# Patient Record
Sex: Male | Born: 1965 | Race: White | Hispanic: No | Marital: Married | Smoking: Former smoker
Health system: Southern US, Community
[De-identification: ages and names within clinical notes are randomized; demographics above are authoritative.]

## PROBLEM LIST (undated history)

## (undated) DIAGNOSIS — H34239 Retinal artery branch occlusion, unspecified eye: Secondary | ICD-10-CM

## (undated) DIAGNOSIS — I4891 Unspecified atrial fibrillation: Secondary | ICD-10-CM

## (undated) DIAGNOSIS — I10 Essential (primary) hypertension: Secondary | ICD-10-CM

## (undated) DIAGNOSIS — Q231 Congenital insufficiency of aortic valve: Secondary | ICD-10-CM

## (undated) DIAGNOSIS — N200 Calculus of kidney: Secondary | ICD-10-CM

## (undated) DIAGNOSIS — G43909 Migraine, unspecified, not intractable, without status migrainosus: Secondary | ICD-10-CM

## (undated) DIAGNOSIS — I639 Cerebral infarction, unspecified: Secondary | ICD-10-CM

## (undated) DIAGNOSIS — R011 Cardiac murmur, unspecified: Secondary | ICD-10-CM

## (undated) HISTORY — PX: TONSILLECTOMY: SUR1361

## (undated) HISTORY — DX: Cardiac murmur, unspecified: R01.1

## (undated) HISTORY — PX: HERNIA REPAIR: SHX51

## (undated) HISTORY — DX: Cerebral infarction, unspecified: I63.9

## (undated) HISTORY — PX: OTHER SURGICAL HISTORY: SHX169

---

## 2000-12-23 ENCOUNTER — Encounter: Payer: Self-pay | Admitting: Internal Medicine

## 2000-12-23 ENCOUNTER — Emergency Department (HOSPITAL_COMMUNITY): Admission: EM | Admit: 2000-12-23 | Discharge: 2000-12-23 | Payer: Self-pay | Admitting: Internal Medicine

## 2004-03-17 ENCOUNTER — Ambulatory Visit: Payer: Self-pay | Admitting: Orthopedic Surgery

## 2004-03-17 ENCOUNTER — Ambulatory Visit (HOSPITAL_COMMUNITY): Admission: RE | Admit: 2004-03-17 | Discharge: 2004-03-17 | Payer: Self-pay | Admitting: Family Medicine

## 2005-01-24 ENCOUNTER — Observation Stay (HOSPITAL_COMMUNITY): Admission: AD | Admit: 2005-01-24 | Discharge: 2005-01-25 | Payer: Self-pay | Admitting: Cardiology

## 2005-01-24 ENCOUNTER — Encounter: Payer: Self-pay | Admitting: Emergency Medicine

## 2005-01-24 ENCOUNTER — Ambulatory Visit: Payer: Self-pay | Admitting: Cardiology

## 2005-01-25 ENCOUNTER — Ambulatory Visit: Payer: Self-pay

## 2005-01-25 ENCOUNTER — Observation Stay (HOSPITAL_COMMUNITY): Admission: AD | Admit: 2005-01-25 | Discharge: 2005-01-26 | Payer: Self-pay | Admitting: Cardiology

## 2005-01-25 ENCOUNTER — Ambulatory Visit: Payer: Self-pay | Admitting: Cardiology

## 2005-01-30 ENCOUNTER — Ambulatory Visit: Payer: Self-pay | Admitting: *Deleted

## 2005-02-03 ENCOUNTER — Ambulatory Visit: Payer: Self-pay | Admitting: Internal Medicine

## 2005-02-07 ENCOUNTER — Ambulatory Visit: Payer: Self-pay | Admitting: *Deleted

## 2005-02-13 ENCOUNTER — Ambulatory Visit: Payer: Self-pay | Admitting: Cardiology

## 2005-02-28 ENCOUNTER — Ambulatory Visit: Payer: Self-pay | Admitting: *Deleted

## 2005-03-24 ENCOUNTER — Ambulatory Visit: Payer: Self-pay | Admitting: Cardiology

## 2005-04-14 ENCOUNTER — Ambulatory Visit: Payer: Self-pay | Admitting: Cardiology

## 2005-05-16 ENCOUNTER — Ambulatory Visit: Payer: Self-pay | Admitting: *Deleted

## 2005-05-30 ENCOUNTER — Ambulatory Visit: Payer: Self-pay | Admitting: *Deleted

## 2005-06-22 ENCOUNTER — Ambulatory Visit: Payer: Self-pay | Admitting: *Deleted

## 2005-07-21 ENCOUNTER — Ambulatory Visit: Payer: Self-pay | Admitting: *Deleted

## 2005-08-23 ENCOUNTER — Ambulatory Visit: Payer: Self-pay | Admitting: Cardiology

## 2005-09-20 ENCOUNTER — Ambulatory Visit: Payer: Self-pay | Admitting: *Deleted

## 2005-10-16 ENCOUNTER — Ambulatory Visit: Payer: Self-pay | Admitting: Cardiology

## 2005-11-20 ENCOUNTER — Ambulatory Visit: Payer: Self-pay | Admitting: Internal Medicine

## 2005-12-21 ENCOUNTER — Ambulatory Visit: Payer: Self-pay | Admitting: Cardiology

## 2006-01-25 ENCOUNTER — Ambulatory Visit: Payer: Self-pay | Admitting: Cardiology

## 2006-02-26 ENCOUNTER — Ambulatory Visit: Payer: Self-pay | Admitting: Cardiology

## 2013-05-25 ENCOUNTER — Encounter (HOSPITAL_COMMUNITY): Payer: Self-pay | Admitting: Emergency Medicine

## 2013-05-25 ENCOUNTER — Emergency Department (HOSPITAL_COMMUNITY)
Admission: EM | Admit: 2013-05-25 | Discharge: 2013-05-25 | Disposition: A | Discharge status: Home / Self Care | Payer: BC Managed Care – PPO | Attending: Emergency Medicine | Admitting: Emergency Medicine

## 2013-05-25 ENCOUNTER — Emergency Department (HOSPITAL_COMMUNITY): Payer: BC Managed Care – PPO

## 2013-05-25 DIAGNOSIS — R5381 Other malaise: Secondary | ICD-10-CM | POA: Insufficient documentation

## 2013-05-25 DIAGNOSIS — R519 Headache, unspecified: Secondary | ICD-10-CM

## 2013-05-25 DIAGNOSIS — Z87891 Personal history of nicotine dependence: Secondary | ICD-10-CM | POA: Insufficient documentation

## 2013-05-25 DIAGNOSIS — R5383 Other fatigue: Secondary | ICD-10-CM

## 2013-05-25 DIAGNOSIS — B349 Viral infection, unspecified: Secondary | ICD-10-CM

## 2013-05-25 DIAGNOSIS — Z8774 Personal history of (corrected) congenital malformations of heart and circulatory system: Secondary | ICD-10-CM | POA: Insufficient documentation

## 2013-05-25 DIAGNOSIS — G43909 Migraine, unspecified, not intractable, without status migrainosus: Secondary | ICD-10-CM | POA: Insufficient documentation

## 2013-05-25 DIAGNOSIS — R51 Headache: Secondary | ICD-10-CM

## 2013-05-25 DIAGNOSIS — B9789 Other viral agents as the cause of diseases classified elsewhere: Secondary | ICD-10-CM | POA: Insufficient documentation

## 2013-05-25 DIAGNOSIS — R21 Rash and other nonspecific skin eruption: Secondary | ICD-10-CM | POA: Insufficient documentation

## 2013-05-25 HISTORY — DX: Unspecified atrial fibrillation: I48.91

## 2013-05-25 HISTORY — DX: Congenital insufficiency of aortic valve: Q23.1

## 2013-05-25 HISTORY — DX: Migraine, unspecified, not intractable, without status migrainosus: G43.909

## 2013-05-25 LAB — COMPREHENSIVE METABOLIC PANEL
ALBUMIN: 3.7 g/dL (ref 3.5–5.2)
ALK PHOS: 163 U/L — AB (ref 39–117)
ALT: 170 U/L — AB (ref 0–53)
AST: 148 U/L — ABNORMAL HIGH (ref 0–37)
BUN: 9 mg/dL (ref 6–23)
CO2: 28 mEq/L (ref 19–32)
Calcium: 9.6 mg/dL (ref 8.4–10.5)
Chloride: 100 mEq/L (ref 96–112)
Creatinine, Ser: 1 mg/dL (ref 0.50–1.35)
GFR calc Af Amer: >90 mL/min (ref >90)
GFR calc non Af Amer: 87 mL/min — ABNORMAL LOW (ref >90)
Glucose, Bld: 133 mg/dL — ABNORMAL HIGH (ref 70–99)
POTASSIUM: 3.9 meq/L (ref 3.7–5.3)
Sodium: 140 mEq/L (ref 137–147)
TOTAL PROTEIN: 7.9 g/dL (ref 6.0–8.3)
Total Bilirubin: 0.3 mg/dL (ref 0.3–1.2)

## 2013-05-25 LAB — CBC WITH DIFFERENTIAL/PLATELET
BASOS PCT: 0 % (ref 0–1)
Basophils Absolute: 0 10*3/uL (ref 0.0–0.1)
Eosinophils Absolute: 0 10*3/uL (ref 0.0–0.7)
Eosinophils Relative: 1 % (ref 0–5)
HCT: 45.6 % (ref 39.0–52.0)
HEMOGLOBIN: 15.6 g/dL (ref 13.0–17.0)
LYMPHS ABS: 1.2 10*3/uL (ref 0.7–4.0)
Lymphocytes Relative: 23 % (ref 12–46)
MCH: 30.4 pg (ref 26.0–34.0)
MCHC: 34.2 g/dL (ref 30.0–36.0)
MCV: 88.7 fL (ref 78.0–100.0)
MONOS PCT: 14 % — AB (ref 3–12)
Monocytes Absolute: 0.7 10*3/uL (ref 0.1–1.0)
NEUTROS ABS: 3.3 10*3/uL (ref 1.7–7.7)
Neutrophils Relative %: 63 % (ref 43–77)
Platelets: 212 10*3/uL (ref 150–400)
RBC: 5.14 MIL/uL (ref 4.22–5.81)
RDW: 11.9 % (ref 11.5–15.5)
WBC: 5.2 10*3/uL (ref 4.0–10.5)

## 2013-05-25 MED ORDER — PROMETHAZINE HCL 25 MG PO TABS: 25.0000 mg | ORAL_TABLET | Freq: Four times a day (QID) | ORAL | Status: DC | PRN

## 2013-05-25 MED ORDER — METOCLOPRAMIDE HCL 5 MG/ML IJ SOLN
10.0000 mg | Freq: Once | INTRAMUSCULAR | Status: AC
  Administered 2013-05-25: 10 mg via INTRAVENOUS
  Filled 2013-05-25: qty 2

## 2013-05-25 MED ORDER — SODIUM CHLORIDE 0.9 % IV BOLUS (SEPSIS)
1000.0000 mL | Freq: Once | INTRAVENOUS | Status: AC
  Administered 2013-05-25: 1000 mL via INTRAVENOUS

## 2013-05-25 MED ORDER — DIPHENHYDRAMINE HCL 50 MG/ML IJ SOLN
25.0000 mg | Freq: Once | INTRAMUSCULAR | Status: AC
  Administered 2013-05-25: 25 mg via INTRAVENOUS
  Filled 2013-05-25: qty 1

## 2013-05-25 MED ORDER — VALACYCLOVIR HCL 1 G PO TABS: 1000.0000 mg | ORAL_TABLET | Freq: Three times a day (TID) | ORAL | Status: AC

## 2013-05-25 MED ORDER — OXYCODONE-ACETAMINOPHEN 5-325 MG PO TABS: 2.0000 | ORAL_TABLET | ORAL | Status: DC | PRN

## 2013-05-25 MED ORDER — KETOROLAC TROMETHAMINE 30 MG/ML IJ SOLN
30.0000 mg | Freq: Once | INTRAMUSCULAR | Status: AC
  Administered 2013-05-25: 30 mg via INTRAVENOUS
  Filled 2013-05-25: qty 1

## 2013-05-25 NOTE — ED Notes (Signed)
Patient complaining of headache x 1 week. Has history of same.

## 2013-05-25 NOTE — Discharge Instructions (Signed)
Increase fluids. Prescriptions for pain medication, nausea medication, antiviral.    Can also take ibuprofen.    Liver function tests were elevated as follows:   AST 148, ALT 170, alkaline phosphatase 163.   Followup your primary care physician to recheck your liver functions.

## 2013-05-25 NOTE — ED Notes (Signed)
Pt alert & oriented x4, stable gait. Patient given discharge instructions, paperwork & prescription(s). Patient  instructed to stop at the registration desk to finish any additional paperwork. Patient verbalized understanding. Pt left department w/ no further questions. 

## 2013-05-27 NOTE — ED Provider Notes (Signed)
CSN: [id number]     Arrival date & time 05/25/13  1931 History   First MD Initiated Contact with Patient 05/25/13 2008     Chief Complaint  Patient presents with  . Migraine   (Consider location/radiation/quality/duration/timing/severity/associated sxs/prior Treatment) HPI.... diffuse headache for one week. Patient had prodromal generalized achiness, cough,  Malaise.  No neurological deficits, stiff neck, petechiae. Has taken over-the-counter products with minimal success.  He is a Psychologist, occupationalwelder by trade. No smoking. Severity is moderate.  Also noted is a rash on his back.  Past Medical History  Diagnosis Date  . Migraines   . Atrial fibrillation   . Bicuspid aortic valve    Past Surgical History  Procedure Laterality Date  . Hernia repair    . Ruptured kidney wall    . Tonsillectomy     History reviewed. No pertinent family history. History  Substance Use Topics  . Smoking status: Former Games developermoker  . Smokeless tobacco: Not on file  . Alcohol Use: Yes     Comment: rarely    Review of Systems  All other systems reviewed and are negative.    Allergies  Review of patient's allergies indicates no known allergies.  Home Medications   Current Outpatient Rx  Name  Route  Sig  Dispense  Refill  . Aspirin-Salicylamide-Caffeine (BC HEADACHE) 325-95-16 MG TABS   Oral   Take 1 packet by mouth daily as needed (for pain).         Marland Kitchen. oxyCODONE-acetaminophen (PERCOCET) 5-325 MG per tablet   Oral   Take 2 tablets by mouth every 4 (four) hours as needed.   20 tablet   0   . promethazine (PHENERGAN) 25 MG tablet   Oral   Take 1 tablet (25 mg total) by mouth every 6 (six) hours as needed for nausea or vomiting.   20 tablet   0   . valACYclovir (VALTREX) 1000 MG tablet   Oral   Take 1 tablet (1,000 mg total) by mouth 3 (three) times daily.   21 tablet   0    BP 122/85  Pulse 78  Temp(Src) 99 F (37.2 C) (Oral)  Resp 20  Ht 6\' 2"  (1.88 m)  Wt 225 lb (102.059 kg)  BMI 28.88  kg/m2  SpO2 99% Physical Exam  Nursing note and vitals reviewed. Constitutional: He is oriented to person, place, and time. He appears well-developed and well-nourished.  HENT:  Head: Normocephalic and atraumatic.  Eyes: Conjunctivae and EOM are normal. Pupils are equal, round, and reactive to light.  Neck:  No meningeal signs.  Cardiovascular: Normal rate, regular rhythm and normal heart sounds.   Pulmonary/Chest: Effort normal and breath sounds normal.  Abdominal: Soft. Bowel sounds are normal.  Musculoskeletal: Normal range of motion.  Neurological: He is alert and oriented to person, place, and time.  Skin:  Erythematous papular rash approximately 2 cm in diameter at approximately T8 at the midline.  Additional satellite lesion of similar nature approximately 1 cm in diameter on the left at approximately T6.  Psychiatric: He has a normal mood and affect. His behavior is normal.    ED Course  Procedures (including critical care time) Labs Review Labs Reviewed  CBC WITH DIFFERENTIAL - Abnormal; Notable for the following:    Monocytes Relative 14 (*)    All other components within normal limits  COMPREHENSIVE METABOLIC PANEL - Abnormal; Notable for the following:    Glucose, Bld 133 (*)    AST 148 (*)  ALT 170 (*)    Alkaline Phosphatase 163 (*)    GFR calc non Af Amer 87 (*)    All other components within normal limits   Imaging Review Ct Head Wo Contrast  05/25/2013   CLINICAL DATA:  Migraine.  EXAM: CT HEAD WITHOUT CONTRAST  TECHNIQUE: Contiguous axial images were obtained from the base of the skull through the vertex without intravenous contrast.  COMPARISON:  None.  FINDINGS: Skull and Sinuses:No significant abnormality.  Orbits: No acute abnormality.  Brain: No evidence of acute abnormality, such as acute infarction, hemorrhage, hydrocephalus, or mass lesion/mass effect.  IMPRESSION: No evidence of acute intracranial disease.   Electronically Signed   By: Tiburcio Pea  M.D.   On: 05/25/2013 21:18    EKG Interpretation   None       MDM   1. Headache   2. Viral syndrome    Patient feels much better after IV fluids and pain management. Rash on back could be herpetic. No clinical evidence of meningitis.  Discharge medications include Percocet, Phenergan 25 mg, Valtrex 1000 mg.  Patient understands to return if worse.    Donnetta Hutching, MD 05/27/13 0800

## 2013-06-01 ENCOUNTER — Emergency Department (HOSPITAL_COMMUNITY)
Admission: EM | Admit: 2013-06-01 | Discharge: 2013-06-01 | Disposition: A | Discharge status: Home / Self Care | Payer: BC Managed Care – PPO | Attending: Emergency Medicine | Admitting: Emergency Medicine

## 2013-06-01 ENCOUNTER — Encounter (HOSPITAL_COMMUNITY): Payer: Self-pay | Admitting: Emergency Medicine

## 2013-06-01 ENCOUNTER — Ambulatory Visit (INDEPENDENT_AMBULATORY_CARE_PROVIDER_SITE_OTHER): Payer: BC Managed Care – PPO | Admitting: Emergency Medicine

## 2013-06-01 VITALS — BP 118/90 | HR 78 | Temp 98.1°F | Resp 16 | Ht 73.25 in | Wt 205.0 lb

## 2013-06-01 DIAGNOSIS — Q231 Congenital insufficiency of aortic valve: Secondary | ICD-10-CM | POA: Insufficient documentation

## 2013-06-01 DIAGNOSIS — I4891 Unspecified atrial fibrillation: Secondary | ICD-10-CM | POA: Insufficient documentation

## 2013-06-01 DIAGNOSIS — R51 Headache: Secondary | ICD-10-CM | POA: Insufficient documentation

## 2013-06-01 DIAGNOSIS — R011 Cardiac murmur, unspecified: Secondary | ICD-10-CM | POA: Insufficient documentation

## 2013-06-01 DIAGNOSIS — Z79899 Other long term (current) drug therapy: Secondary | ICD-10-CM | POA: Insufficient documentation

## 2013-06-01 DIAGNOSIS — R21 Rash and other nonspecific skin eruption: Secondary | ICD-10-CM

## 2013-06-01 DIAGNOSIS — J029 Acute pharyngitis, unspecified: Secondary | ICD-10-CM | POA: Insufficient documentation

## 2013-06-01 DIAGNOSIS — Z87891 Personal history of nicotine dependence: Secondary | ICD-10-CM | POA: Insufficient documentation

## 2013-06-01 DIAGNOSIS — R519 Headache, unspecified: Secondary | ICD-10-CM

## 2013-06-01 DIAGNOSIS — R5383 Other fatigue: Secondary | ICD-10-CM

## 2013-06-01 DIAGNOSIS — R5381 Other malaise: Secondary | ICD-10-CM | POA: Insufficient documentation

## 2013-06-01 DIAGNOSIS — M542 Cervicalgia: Secondary | ICD-10-CM | POA: Insufficient documentation

## 2013-06-01 DIAGNOSIS — B370 Candidal stomatitis: Secondary | ICD-10-CM

## 2013-06-01 DIAGNOSIS — R748 Abnormal levels of other serum enzymes: Secondary | ICD-10-CM

## 2013-06-01 DIAGNOSIS — G43909 Migraine, unspecified, not intractable, without status migrainosus: Secondary | ICD-10-CM | POA: Insufficient documentation

## 2013-06-01 LAB — COMPREHENSIVE METABOLIC PANEL
ALK PHOS: 222 U/L — AB (ref 39–117)
ALT: 75 U/L — ABNORMAL HIGH (ref 0–53)
ALT: 68 U/L — ABNORMAL HIGH (ref 0–53)
AST: 33 U/L (ref 0–37)
AST: 29 U/L (ref 0–37)
Albumin: 4 g/dL (ref 3.5–5.2)
Albumin: 3.4 g/dL — ABNORMAL LOW (ref 3.5–5.2)
Alkaline Phosphatase: 211 U/L — ABNORMAL HIGH (ref 39–117)
BILIRUBIN TOTAL: 0.3 mg/dL (ref 0.3–1.2)
BUN: 10 mg/dL (ref 6–23)
BUN: 10 mg/dL (ref 6–23)
CHLORIDE: 100 meq/L (ref 96–112)
CO2: 23 mEq/L (ref 19–32)
CO2: 26 mEq/L (ref 19–32)
Calcium: 9.5 mg/dL (ref 8.4–10.5)
Calcium: 9.3 mg/dL (ref 8.4–10.5)
Chloride: 103 mEq/L (ref 96–112)
Creat: 0.85 mg/dL (ref 0.50–1.35)
Creatinine, Ser: 0.9 mg/dL (ref 0.50–1.35)
GFR calc Af Amer: >90 mL/min (ref >90)
GLUCOSE: 103 mg/dL — AB (ref 70–99)
Glucose, Bld: 99 mg/dL (ref 70–99)
POTASSIUM: 4.2 meq/L (ref 3.7–5.3)
Potassium: 4.7 mEq/L (ref 3.5–5.3)
Sodium: 140 mEq/L (ref 135–145)
Sodium: 141 mEq/L (ref 137–147)
Total Bilirubin: 0.5 mg/dL (ref 0.3–1.2)
Total Protein: 7.5 g/dL (ref 6.0–8.3)
Total Protein: 7.6 g/dL (ref 6.0–8.3)

## 2013-06-01 LAB — CBC WITH DIFFERENTIAL/PLATELET
Basophils Absolute: 0 10*3/uL (ref 0.0–0.1)
Basophils Relative: 0 % (ref 0–1)
Eosinophils Absolute: 0.1 10*3/uL (ref 0.0–0.7)
Eosinophils Relative: 1 % (ref 0–5)
HCT: 40.3 % (ref 39.0–52.0)
HEMOGLOBIN: 14.1 g/dL (ref 13.0–17.0)
LYMPHS ABS: 1.2 10*3/uL (ref 0.7–4.0)
Lymphocytes Relative: 25 % (ref 12–46)
MCH: 30.9 pg (ref 26.0–34.0)
MCHC: 35 g/dL (ref 30.0–36.0)
MCV: 88.4 fL (ref 78.0–100.0)
MONO ABS: 0.5 10*3/uL (ref 0.1–1.0)
Monocytes Relative: 9 % (ref 3–12)
NEUTROS ABS: 3.1 10*3/uL (ref 1.7–7.7)
NEUTROS PCT: 64 % (ref 43–77)
Platelets: 301 10*3/uL (ref 150–400)
RBC: 4.56 MIL/uL (ref 4.22–5.81)
RDW: 12.1 % (ref 11.5–15.5)
WBC: 4.9 10*3/uL (ref 4.0–10.5)

## 2013-06-01 LAB — CSF CELL COUNT WITH DIFFERENTIAL
RBC COUNT CSF: 6 /mm3 — AB
RBC Count, CSF: 157 /mm3 — ABNORMAL HIGH
TUBE #: 1
TUBE #: 4
WBC, CSF: 1 /mm3 (ref 0–5)
WBC, CSF: 2 /mm3 (ref 0–5)

## 2013-06-01 LAB — GRAM STAIN: SPECIAL REQUESTS: NORMAL

## 2013-06-01 LAB — POCT CBC
Granulocyte percent: 67 %G (ref 37–80)
HCT, POC: 48.4 % (ref 43.5–53.7)
Hemoglobin: 15.1 g/dL (ref 14.1–18.1)
Lymph, poc: 1.2 (ref 0.6–3.4)
MCH, POC: 29.5 pg (ref 27–31.2)
MCHC: 31.2 g/dL — AB (ref 31.8–35.4)
MCV: 94.6 fL (ref 80–97)
MID (cbc): 0.3 (ref 0–0.9)
MPV: 8.8 fL (ref 0–99.8)
POC Granulocyte: 3 (ref 2–6.9)
POC LYMPH PERCENT: 26.3 %L (ref 10–50)
POC MID %: 6.7 %M (ref 0–12)
Platelet Count, POC: 339 10*3/uL (ref 142–424)
RBC: 5.12 M/uL (ref 4.69–6.13)
RDW, POC: 12.8 %
WBC: 4.5 10*3/uL — AB (ref 4.6–10.2)

## 2013-06-01 LAB — CRYPTOCOCCAL ANTIGEN: Crypto Ag: NEGATIVE

## 2013-06-01 LAB — HEPATITIS PANEL, ACUTE
HCV Ab: NEGATIVE
Hep A IgM: NONREACTIVE
Hep B C IgM: NONREACTIVE
Hepatitis B Surface Ag: NEGATIVE

## 2013-06-01 LAB — GLUCOSE, CSF: Glucose, CSF: 63 mg/dL (ref 43–76)

## 2013-06-01 LAB — HIV ANTIBODY (ROUTINE TESTING W REFLEX): HIV: NONREACTIVE

## 2013-06-01 LAB — PROTEIN, CSF: Total  Protein, CSF: 19 mg/dL (ref 15–45)

## 2013-06-01 LAB — POCT SKIN KOH: Skin KOH, POC: POSITIVE

## 2013-06-01 MED ORDER — OXYCODONE-ACETAMINOPHEN 5-325 MG PO TABS: 2.0000 | ORAL_TABLET | ORAL | Status: DC | PRN

## 2013-06-01 MED ORDER — CLOTRIMAZOLE 10 MG MT TROC
10.0000 mg | Freq: Every day | OROMUCOSAL | Status: DC
Start: 2013-06-01 — End: 2013-08-06

## 2013-06-01 NOTE — Discharge Instructions (Signed)
Finish your Valtrex. Fill and take the medicines given to you for the thrush. Percocet as needed for pain. Recheck with fever rash worsening or neck symptoms. Enjoy that fish tank!!!!!!!!!!!!!!!!!!!!!  Headaches, Frequently Asked Questions MIGRAINE HEADACHES Q: What is migraine? What causes it? How can I treat it? A: Generally, migraine headaches begin as a dull ache. Then they develop into a constant, throbbing, and pulsating pain. You may experience pain at the temples. You may experience pain at the front or back of one or both sides of the head. The pain is usually accompanied by a combination of:  Nausea.  Vomiting.  Sensitivity to light and noise. Some people (about 15%) experience an aura (see below) before an attack. The cause of migraine is believed to be chemical reactions in the brain. Treatment for migraine may include over-the-counter or prescription medications. It may also include self-help techniques. These include relaxation training and biofeedback.  Q: What is an aura? A: About 15% of people with migraine get an "aura". This is a sign of neurological symptoms that occur before a migraine headache. You may see wavy or jagged lines, dots, or flashing lights. You might experience tunnel vision or blind spots in one or both eyes. The aura can include visual or auditory hallucinations (something imagined). It may include disruptions in smell (such as strange odors), taste or touch. Other symptoms include:  Numbness.  A "pins and needles" sensation.  Difficulty in recalling or speaking the correct word. These neurological events may last as long as 60 minutes. These symptoms will fade as the headache begins. Q: What is a trigger? A: Certain physical or environmental factors can lead to or "trigger" a migraine. These include:  Foods.  Hormonal changes.  Weather.  Stress. It is important to remember that triggers are different for everyone. To help prevent migraine  attacks, you need to figure out which triggers affect you. Keep a headache diary. This is a good way to track triggers. The diary will help you talk to your healthcare professional about your condition. Q: Does weather affect migraines? A: Bright sunshine, hot, humid conditions, and drastic changes in barometric pressure may lead to, or "trigger," a migraine attack in some people. But studies have shown that weather does not act as a trigger for everyone with migraines. Q: What is the link between migraine and hormones? A: Hormones start and regulate many of your body's functions. Hormones keep your body in balance within a constantly changing environment. The levels of hormones in your body are unbalanced at times. Examples are during menstruation, pregnancy, or menopause. That can lead to a migraine attack. In fact, about three quarters of all women with migraine report that their attacks are related to the menstrual cycle.  Q: Is there an increased risk of stroke for migraine sufferers? A: The likelihood of a migraine attack causing a stroke is very remote. That is not to say that migraine sufferers cannot have a stroke associated with their migraines. In persons under age 33, the most common associated factor for stroke is migraine headache. But over the course of a person's normal life span, the occurrence of migraine headache may actually be associated with a reduced risk of dying from cerebrovascular disease due to stroke.  Q: What are acute medications for migraine? A: Acute medications are used to treat the pain of the headache after it has started. Examples over-the-counter medications, NSAIDs, ergots, and triptans.  Q: What are the triptans? A: Triptans are the newest  class of abortive medications. They are specifically targeted to treat migraine. Triptans are vasoconstrictors. They moderate some chemical reactions in the brain. The triptans work on receptors in your brain. Triptans help to  restore the balance of a neurotransmitter called serotonin. Fluctuations in levels of serotonin are thought to be a main cause of migraine.  Q: Are over-the-counter medications for migraine effective? A: Over-the-counter, or "OTC," medications may be effective in relieving mild to moderate pain and associated symptoms of migraine. But you should see your caregiver before beginning any treatment regimen for migraine.  Q: What are preventive medications for migraine? A: Preventive medications for migraine are sometimes referred to as "prophylactic" treatments. They are used to reduce the frequency, severity, and length of migraine attacks. Examples of preventive medications include antiepileptic medications, antidepressants, beta-blockers, calcium channel blockers, and NSAIDs (nonsteroidal anti-inflammatory drugs). Q: Why are anticonvulsants used to treat migraine? A: During the past few years, there has been an increased interest in antiepileptic drugs for the prevention of migraine. They are sometimes referred to as "anticonvulsants". Both epilepsy and migraine may be caused by similar reactions in the brain.  Q: Why are antidepressants used to treat migraine? A: Antidepressants are typically used to treat people with depression. They may reduce migraine frequency by regulating chemical levels, such as serotonin, in the brain.  Q: What alternative therapies are used to treat migraine? A: The term "alternative therapies" is often used to describe treatments considered outside the scope of conventional Western medicine. Examples of alternative therapy include acupuncture, acupressure, and yoga. Another common alternative treatment is herbal therapy. Some herbs are believed to relieve headache pain. Always discuss alternative therapies with your caregiver before proceeding. Some herbal products contain arsenic and other toxins. TENSION HEADACHES Q: What is a tension-type headache? What causes it? How can I  treat it? A: Tension-type headaches occur randomly. They are often the result of temporary stress, anxiety, fatigue, or anger. Symptoms include soreness in your temples, a tightening band-like sensation around your head (a "vice-like" ache). Symptoms can also include a pulling feeling, pressure sensations, and contracting head and neck muscles. The headache begins in your forehead, temples, or the back of your head and neck. Treatment for tension-type headache may include over-the-counter or prescription medications. Treatment may also include self-help techniques such as relaxation training and biofeedback. CLUSTER HEADACHES Q: What is a cluster headache? What causes it? How can I treat it? A: Cluster headache gets its name because the attacks come in groups. The pain arrives with little, if any, warning. It is usually on one side of the head. A tearing or bloodshot eye and a runny nose on the same side of the headache may also accompany the pain. Cluster headaches are believed to be caused by chemical reactions in the brain. They have been described as the most severe and intense of any headache type. Treatment for cluster headache includes prescription medication and oxygen. SINUS HEADACHES Q: What is a sinus headache? What causes it? How can I treat it? A: When a cavity in the bones of the face and skull (a sinus) becomes inflamed, the inflammation will cause localized pain. This condition is usually the result of an allergic reaction, a tumor, or an infection. If your headache is caused by a sinus blockage, such as an infection, you will probably have a fever. An x-ray will confirm a sinus blockage. Your caregiver's treatment might include antibiotics for the infection, as well as antihistamines or decongestants.  REBOUND HEADACHES Q:  What is a rebound headache? What causes it? How can I treat it? A: A pattern of taking acute headache medications too often can lead to a condition known as "rebound  headache." A pattern of taking too much headache medication includes taking it more than 2 days per week or in excessive amounts. That means more than the label or a caregiver advises. With rebound headaches, your medications not only stop relieving pain, they actually begin to cause headaches. Doctors treat rebound headache by tapering the medication that is being overused. Sometimes your caregiver will gradually substitute a different type of treatment or medication. Stopping may be a challenge. Regularly overusing a medication increases the potential for serious side effects. Consult a caregiver if you regularly use headache medications more than 2 days per week or more than the label advises. ADDITIONAL QUESTIONS AND ANSWERS Q: What is biofeedback? A: Biofeedback is a self-help treatment. Biofeedback uses special equipment to monitor your body's involuntary physical responses. Biofeedback monitors:  Breathing.  Pulse.  Heart rate.  Temperature.  Muscle tension.  Brain activity. Biofeedback helps you refine and perfect your relaxation exercises. You learn to control the physical responses that are related to stress. Once the technique has been mastered, you do not need the equipment any more. Q: Are headaches hereditary? A: Four out of five (80%) of people that suffer report a family history of migraine. Scientists are not sure if this is genetic or a family predisposition. Despite the uncertainty, a child has a 50% chance of having migraine if one parent suffers. The child has a 75% chance if both parents suffer.  Q: Can children get headaches? A: By the time they reach high school, most young people have experienced some type of headache. Many safe and effective approaches or medications can prevent a headache from occurring or stop it after it has begun.  Q: What type of doctor should I see to diagnose and treat my headache? A: Start with your primary caregiver. Discuss his or her  experience and approach to headaches. Discuss methods of classification, diagnosis, and treatment. Your caregiver may decide to recommend you to a headache specialist, depending upon your symptoms or other physical conditions. Having diabetes, allergies, etc., may require a more comprehensive and inclusive approach to your headache. The National Headache Foundation will provide, upon request, a list of [facility] physician members in your state. Document Released: 07/22/2003 Document Revised: 07/24/2011 Document Reviewed: 12/30/2007 [facility] Patient Information 2014 [employer], [place].

## 2013-06-01 NOTE — ED Notes (Signed)
Lab called, reporting that there were no WBC's or organisms in his gram stain. Dr. Fayrene FearingJames made aware.

## 2013-06-01 NOTE — ED Notes (Addendum)
C/o h/a & neck pain x 3 weeks. Has history of migraines but states this id different. Reports pain moves from top of head to sides of neck. Presently denies h/a but has pain to sides of neck. States pain in muscles of neck which are tender to palpation. States when pain gets worse in neck that it radiates along shoulders & makes the muscles spasm. Reports massage has helped with neck pain. Denies n/v, photophobia. [facility] ago for generalized pain & was diagnosed with shingles. No visible sore/blisters seen on back. Family reports pt had a fever on Friday, none since then. Denies cold, cough.

## 2013-06-01 NOTE — ED Notes (Signed)
EDMD at bedside

## 2013-06-01 NOTE — ED Notes (Signed)
Consent for LP obtained

## 2013-06-01 NOTE — ED Notes (Signed)
Pt presents to department for evaluation of headache, neck tenderness and fatigue feeling. Was sent to ED by PCP for possible spinal tap. Pt states he has been feeling bad x3 weeks, [facility] and diagnosed with shingles. Pt is alert and oriented x4. States he continues to feel bad and wants to know what is going on.

## 2013-06-01 NOTE — ED Notes (Signed)
Lumbar Puncture complete. Pt lying flat on back.

## 2013-06-01 NOTE — Progress Notes (Signed)
Subjective:    Patient ID: Nathaniel Pope, male    DOB: 10-Jun-1965, 48 y.o.   MRN: [id number]  HPI 48 year old male presents for evaluation of almost 2 week history of intermittent headache, neck tightness, fevers, and sweats.  Was seen at the ED on 1/11 for evaluation of headache and rash on his back.  Was treated for possible shingles and given pain medication for his headache. Had a negative CT scan of his head. CBC was normal but LFT's were elevated.  He has no hx of any medical problems except hospitalization for atrial fibrillation 7 years ago.    Today is complaining of a sore throat and rash on his back. Admits his headache is slightly better today and his neck stiffness has improved somewhat. Had severe night sweats last night. His wife states he had a 103 temp on 1/16 but he is afebrile today.  He continues to have fatigue and a rash on his back.  States the rash has spread to cover his entire back and was extremely pruritic. That has also improved some.  He has stopped the Valtrex and phenergan. Has run out of Percocet. No hx of drug allergies.   Denies concerns about HIV or Hepatitis. Has been monogamous with his wife for 20 years.  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  Has several tattoos all from licensed tattoo artists.    Admits he used to drink alcohol heavily but now drinks <1 beer per week.    Has not had an exam or labwork in "years" but has no known hx of HTN, DM, or hyperlipidemia.    No recent foreign travel. Does travel quite a bit within the states for work.    Denies nausea, vomiting, dizziness, abdominal pain, arthralgias, or myalgias.     Review of Systems  Constitutional: Positive for fever, chills and fatigue.  HENT: Positive for sore throat and trouble swallowing. Negative for congestion, ear pain, postnasal drip and rhinorrhea.   Eyes: Negative for visual disturbance.  Respiratory: Negative for cough.   Cardiovascular: Negative for  chest pain.  Gastrointestinal: Negative for nausea, vomiting and abdominal pain.  Musculoskeletal: Positive for neck stiffness. Negative for arthralgias and myalgias.  Skin: Positive for rash.  Neurological: Positive for headaches. Negative for dizziness.       Objective:   Physical Exam  Constitutional: He is oriented to person, place, and time. He appears well-developed and well-nourished.  HENT:  Head: Normocephalic and atraumatic.  Right Ear: Hearing, tympanic membrane, external ear and ear canal normal.  Left Ear: Hearing, tympanic membrane, external ear and ear canal normal.  Mouth/Throat: Uvula is midline. No tonsillar abscesses.  There are white patches on his oropharynx and tongue. Multiple ulcers on the posterior pharynx  Eyes: Conjunctivae are normal. Pupils are equal, round, and reactive to light.  Fundoscopic exam normal  Neck: Normal range of motion. Neck supple.  Able to flex and extend neck fully but does have some "stiffness" with extreme flexion  Cardiovascular: Normal rate and regular rhythm.   Murmur heard. Pulmonary/Chest: Effort normal and breath sounds normal.  Neurological: He is alert and oriented to person, place, and time.  Psychiatric: He has a normal mood and affect. His behavior is normal. Judgment and thought content normal.    Results for orders placed in visit on 06/01/13  POCT CBC      Result Value Range   WBC 4.5 (*) 4.6 - 10.2 K/uL  Lymph, poc 1.2  0.6 - 3.4   POC LYMPH PERCENT 26.3  10 - 50 %L   MID (cbc) 0.3  0 - 0.9   POC MID % 6.7  0 - 12 %M   POC Granulocyte 3.0  2 - 6.9   Granulocyte percent 67.0  37 - 80 %G   RBC 5.12  4.69 - 6.13 M/uL   Hemoglobin 15.1  14.1 - 18.1 g/dL   HCT, POC 69.6  29.5 - 53.7 %   MCV 94.6  80 - 97 fL   MCH, POC 29.5  27 - 31.2 pg   MCHC 31.2 (*) 31.8 - 35.4 g/dL   RDW, POC 28.4     Platelet Count, POC 339  142 - 424 K/uL   MPV 8.8  0 - 99.8 fL  POCT SKIN KOH      Result Value Range   Skin KOH, POC  Positive           Assessment & Plan:  Oropharyngeal candidiasis - Plan: clotrimazole (MYCELEX) 10 MG troche, Cryptococcal Antigen  Headache(784.0) - Plan: HIV antibody, Culture, Blood, Single Set Only  Abnormal liver enzymes - Plan: Comprehensive metabolic panel, HIV antibody, Hepatitis panel, acute, CMV IgM  Acute pharyngitis - Plan: POCT CBC, POCT Skin KOH, Epstein-Barr virus VCA antibody panel  Rash and nonspecific skin eruption - Plan: HIV antibody, [place] spotted fvr ab, IgM-blood, B. burgdorfi antibodies, Culture, Blood, Single Set Only  Bicuspid aortic valve - Plan: Culture, Blood, Single Set Only Discussed with Dr. Cleta Alberts who spoke with Dr. Synthia Innocent, physician on call for ID. He recommends LP today so patient will go go [facility] via private vehicle for further testing Labs pending Rx for clotrimazole troches to use 5x/day x 14 days Will determine follow up based on results.

## 2013-06-01 NOTE — ED Provider Notes (Signed)
CSN: [id number]     Arrival date & time 06/01/13  1146 History   First MD Initiated Contact with Patient 06/01/13 1211     Chief Complaint  Patient presents with  . Headache  . Fatigue  . Neck Pain    HPI  Patient presents with headache and neck pain. He describes a two-week illness. 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 This was about 2 weeks ago. He returned home. The next 4-5 days and been having body aches and headaches. He has a history of migraines but this might be a migraine. His symptoms were unrelieved. The last Sunday he went any 10. His given symptomatic relief for his headache. Had a negative CT scan. Had a small area of erythema on his back and it is thought to perhaps be shingles. Valtrex. Discharged. He continued for 5 more days of headache. His head and neck were painful but not stiff. His headache almost resolved last night he went to a physician today at an urgent care center. Noted to have thrush. He was told he may have had meningitis and therefore was referred here for definitive testing with lumbar puncture.  Past Medical History  Diagnosis Date  . Migraines   . Atrial fibrillation   . Bicuspid aortic valve   . Heart murmur    Past Surgical History  Procedure Laterality Date  . Hernia repair    . Ruptured kidney wall    . Tonsillectomy     History reviewed. No pertinent family history. History  Substance Use Topics  . Smoking status: Former Games developermoker  . Smokeless tobacco: Not on file  . Alcohol Use: Yes     Comment: rarely    Review of Systems  Constitutional: Negative for fever, chills, diaphoresis, appetite change and fatigue.  HENT: Positive for sore throat. Negative for mouth sores and trouble swallowing.   Eyes: Negative for visual disturbance.  Respiratory: Negative for cough, chest tightness, shortness of breath and wheezing.   Cardiovascular: Negative for chest pain.  Gastrointestinal: Negative for nausea, vomiting,  abdominal pain, diarrhea and abdominal distention.  Endocrine: Negative for polydipsia, polyphagia and polyuria.  Genitourinary: Negative for dysuria, frequency and hematuria.  Musculoskeletal: Positive for neck pain. Negative for gait problem.  Skin: Positive for rash. Negative for color change and pallor.       Small plaque of erythema in the midthoracic skin posterior leg. No additional vesicles. Not painful. Nontender.  Neurological: Positive for headaches. Negative for dizziness, syncope and light-headedness.  Hematological: Does not bruise/bleed easily.  Psychiatric/Behavioral: Negative for behavioral problems and confusion.    Allergies  Review of patient's allergies indicates no known allergies.  Home Medications   Current Outpatient Rx  Name  Route  Sig  Dispense  Refill  . acetaminophen (TYLENOL) 160 MG/5ML solution   Oral   Take 160 mg by mouth every 6 (six) hours as needed.         . Aspirin-Salicylamide-Caffeine (BC HEADACHE) 325-95-16 MG TABS   Oral   Take 1 packet by mouth daily as needed (for pain).         . clotrimazole (MYCELEX) 10 MG troche   Oral   Take 1 tablet (10 mg total) by mouth 5 (five) times daily.   70 tablet   0   . ibuprofen (ADVIL,MOTRIN) 600 MG tablet   Oral   Take 600 mg by mouth every 6 (six) hours as needed.         .Marland Kitchen  oxyCODONE-acetaminophen (PERCOCET) 5-325 MG per tablet   Oral   Take 2 tablets by mouth every 4 (four) hours as needed.   20 tablet   0   . promethazine (PHENERGAN) 25 MG tablet   Oral   Take 1 tablet (25 mg total) by mouth every 6 (six) hours as needed for nausea or vomiting.   20 tablet   0   . valACYclovir (VALTREX) 1000 MG tablet   Oral   Take 1 tablet (1,000 mg total) by mouth 3 (three) times daily.   21 tablet   0   . oxyCODONE-acetaminophen (PERCOCET/ROXICET) 5-325 MG per tablet   Oral   Take 2 tablets by mouth every 4 (four) hours as needed.   6 tablet   0    BP 107/75  Pulse 70  Temp(Src)  97.4 F (36.3 C) (Oral)  Resp 18  SpO2 96% Physical Exam  Constitutional: He is oriented to person, place, and time. He appears well-developed and well-nourished. No distress.  HENT:  Head: Normocephalic.  Oropharyngeal thrush. Supple neck. Benign pharynx. No TM changes  Eyes: Conjunctivae are normal. Pupils are equal, round, and reactive to light. No scleral icterus.  Neck: Normal range of motion. Neck supple. No thyromegaly present.  Supple. No meningismus.  Cardiovascular: Normal rate and regular rhythm.  Exam reveals no gallop and no friction rub.   No murmur heard. Pulmonary/Chest: Effort normal and breath sounds normal. No respiratory distress. He has no wheezes. He has no rales.  Abdominal: Soft. Bowel sounds are normal. He exhibits no distension. There is no tenderness. There is no rebound.  Musculoskeletal: Normal range of motion.  Neurological: He is alert and oriented to person, place, and time.  Skin: Skin is warm and dry. No rash noted.  No petechiae. No ecchymosis. Small plaque in the mid thoracic spine. Does not appear vesicular to  Psychiatric: He has a normal mood and affect. His behavior is normal.    ED Course  LUMBAR PUNCTURE Date/Time: 06/01/2013 3:45 PM Performed by: Rolland Porter Authorized by: Rolland Porter Consent: Verbal consent obtained. written consent obtained. Risks and benefits: risks, benefits and alternatives were discussed Consent given by: patient Patient understanding: patient states understanding of the procedure being performed Patient consent: the patient's understanding of the procedure matches consent given Procedure consent: procedure consent matches procedure scheduled Test results: test results available and properly labeled Patient identity confirmed: verbally with patient and arm band Indications: evaluation for infection Anesthesia: local infiltration Local anesthetic: lidocaine 1% without epinephrine Anesthetic total: 3 ml Patient  sedated: no Preparation: Patient was prepped and draped in the usual sterile fashion. Lumbar space: L3-L4 interspace Patient's position: left lateral decubitus Needle gauge: 22 Needle type: diamond point Needle length: 3.5 in Number of attempts: 2 Fluid appearance: clear Tubes of fluid: 4 Total volume: 4 ml Post-procedure: site cleaned and adhesive bandage applied Patient tolerance: Patient tolerated the procedure well with no immediate complications.   (including critical care time) Labs Review Labs Reviewed  COMPREHENSIVE METABOLIC PANEL - Abnormal; Notable for the following:    Glucose, Bld 103 (*)    Albumin 3.4 (*)    ALT 68 (*)    Alkaline Phosphatase 222 (*)    All other components within normal limits  CSF CELL COUNT WITH DIFFERENTIAL - Abnormal; Notable for the following:    RBC Count, CSF 157 (*)    All other components within normal limits  CSF CELL COUNT WITH DIFFERENTIAL - Abnormal; Notable for the following:  RBC Count, CSF 6 (*)    All other components within normal limits  GRAM STAIN  CSF CULTURE  CBC WITH DIFFERENTIAL  GLUCOSE, CSF  PROTEIN, CSF  HERPES SIMPLEX VIRUS(HSV) DNA BY PCR   Imaging Review No results found.  EKG Interpretation   None       MDM   1. Headache   2. Thrush    Patient remains essentially asymptomatic. Spinal fluid shows one white blood cell, 6 red blood cells in tube 4. His possible recent meningitis however his symptoms would resolve yesterday. Otic speculum continued pleocytosis. He is appropriate for outpatient treatment. He's been given prescriptions for his thrush. He will follow up with primary care physician. Recheck with headache, recurrent fever, rash, or other new or worsening symptoms    Rolland Porter, MD 06/01/13 1546

## 2013-06-02 ENCOUNTER — Telehealth: Payer: Self-pay | Admitting: *Deleted

## 2013-06-02 LAB — HERPES SIMPLEX VIRUS(HSV) DNA BY PCR
HSV 1 DNA: NOT DETECTED
HSV 2 DNA: NOT DETECTED

## 2013-06-02 LAB — B. BURGDORFI ANTIBODIES: B burgdorferi Ab IgG+IgM: 0.67 {ISR}

## 2013-06-02 MED ORDER — OXYCODONE-ACETAMINOPHEN 5-325 MG PO TABS: 1.0000 | ORAL_TABLET | Freq: Four times a day (QID) | ORAL | Status: DC | PRN

## 2013-06-02 NOTE — Telephone Encounter (Signed)
Spoke to pt wife. She is very upset over husbands condition. Talked to her about the lab results. She will be by today to pick up the script in the pick up drawer.

## 2013-06-02 NOTE — Telephone Encounter (Signed)
Nathaniel Pope at 06/02/2013 9:46 AM     Status: Signed        Patient was seen at the hospital yesterday and prescribed pain medication. Requesting more pain medication.  [phone]    pt wife states that he was given only 6 tabs in the er.  She wanted you to know that he will need more.

## 2013-06-02 NOTE — Telephone Encounter (Signed)
Rx for percocet refilled.  Use sparingly. I do expect his symptoms to be improving.  So far his labs have looked good. His liver function tests have almost returned to normal, HIV negative, hepatitis panel negative, and Lyme was negative

## 2013-06-03 LAB — EPSTEIN-BARR VIRUS VCA ANTIBODY PANEL
EBV EA IgG: 40.3 U/mL — ABNORMAL HIGH (ref <9.0)
EBV NA IgG: 5.4 U/mL (ref <18.0)
EBV VCA IgG: 145 U/mL — ABNORMAL HIGH (ref <18.0)
EBV VCA IgM: <10 U/mL (ref <36.0)

## 2013-06-03 LAB — CMV IGM: CMV IgM: <8 AU/mL (ref <30.00)

## 2013-06-04 LAB — ROCKY MTN SPOTTED FVR AB, IGM-BLOOD: ROCKY MTN SPOTTED FEVER, IGM: 0.11 IV

## 2013-06-05 LAB — CSF CULTURE W GRAM STAIN: Culture: NO GROWTH

## 2013-06-05 LAB — CSF CULTURE
Gram Stain: NONE SEEN
SPECIAL REQUESTS: NORMAL

## 2013-06-06 ENCOUNTER — Telehealth: Payer: Self-pay | Admitting: *Deleted

## 2013-06-06 NOTE — Telephone Encounter (Signed)
Spoke to patient he is doing much better. States he is not 100% yet but he is well enough he will be returning to work Monday. He will be out of town for work.  He has headaches only on occasion. He has been using the percocet's and has 10-15 left. The sore throat has completely resolved. Denies any mouth pain at this time also.

## 2013-06-06 NOTE — Telephone Encounter (Signed)
Great. He should follow up with any worsening or changing sx's

## 2013-06-12 LAB — CULTURE, BLOOD, SINGLE SET ONLY: Organism ID, Bacteria: NO GROWTH

## 2013-08-06 ENCOUNTER — Ambulatory Visit (INDEPENDENT_AMBULATORY_CARE_PROVIDER_SITE_OTHER): Payer: BC Managed Care – PPO | Admitting: Family Medicine

## 2013-08-06 ENCOUNTER — Encounter: Payer: Self-pay | Admitting: Family Medicine

## 2013-08-06 ENCOUNTER — Ambulatory Visit: Payer: BC Managed Care – PPO

## 2013-08-06 VITALS — BP 126/90 | HR 69 | Temp 97.9°F | Resp 16 | Ht 75.0 in | Wt 215.2 lb

## 2013-08-06 DIAGNOSIS — M25562 Pain in left knee: Secondary | ICD-10-CM

## 2013-08-06 DIAGNOSIS — M25462 Effusion, left knee: Secondary | ICD-10-CM

## 2013-08-06 DIAGNOSIS — M25469 Effusion, unspecified knee: Secondary | ICD-10-CM

## 2013-08-06 DIAGNOSIS — M25569 Pain in unspecified knee: Secondary | ICD-10-CM

## 2013-08-06 MED ORDER — DICLOFENAC SODIUM 75 MG PO TBEC: 75.0000 mg | DELAYED_RELEASE_TABLET | Freq: Two times a day (BID) | ORAL | Status: DC

## 2013-08-06 NOTE — Patient Instructions (Signed)
Take diclofenac one twice daily for pain and inflammation.  You can take some Tylenol in addition to that if needed for further pain  Wear the knee sleeve as discussed  Return if problems persist

## 2013-08-06 NOTE — Progress Notes (Addendum)
Subjective: Patient is here with a history of having had some mild pain in his left knee for a long time intermittently. However only the last week or so as he had more problems. He has developed some swelling in his left knee. It pops when he uses it, mostly under the kneecap. The pain however is more medial. 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 A small to moderate palpable effusion. Tender medial to the somewhat behind the patella. Range of motion is good but there is a slight popping intermittently palpable under the lateral aspect of the knee. Ligaments seem intact. Walks with a pronounced limp   Assessment: Left knee pain and effusion  Plan: X-ray left knee  [facility] reading (PRIMARY) by  Dr. Alwyn RenHopper Normal knee.  Discussed treatment options.  #1: Anti-inflammatories and a knee brace or #2 aspirate and inject and anti-inflammatories and a knee brace #3 refer to specialist  Plan: Patient shows the first option

## 2013-10-04 ENCOUNTER — Encounter (HOSPITAL_COMMUNITY): Payer: Self-pay | Admitting: Emergency Medicine

## 2013-10-04 ENCOUNTER — Emergency Department (HOSPITAL_COMMUNITY)
Admission: EM | Admit: 2013-10-04 | Discharge: 2013-10-04 | Disposition: A | Discharge status: Home / Self Care | Payer: BC Managed Care – PPO | Attending: Emergency Medicine | Admitting: Emergency Medicine

## 2013-10-04 DIAGNOSIS — Z87891 Personal history of nicotine dependence: Secondary | ICD-10-CM | POA: Insufficient documentation

## 2013-10-04 DIAGNOSIS — Z9889 Other specified postprocedural states: Secondary | ICD-10-CM | POA: Insufficient documentation

## 2013-10-04 DIAGNOSIS — N23 Unspecified renal colic: Secondary | ICD-10-CM | POA: Insufficient documentation

## 2013-10-04 DIAGNOSIS — Z79899 Other long term (current) drug therapy: Secondary | ICD-10-CM | POA: Insufficient documentation

## 2013-10-04 DIAGNOSIS — Z791 Long term (current) use of non-steroidal anti-inflammatories (NSAID): Secondary | ICD-10-CM | POA: Insufficient documentation

## 2013-10-04 DIAGNOSIS — R011 Cardiac murmur, unspecified: Secondary | ICD-10-CM | POA: Insufficient documentation

## 2013-10-04 DIAGNOSIS — G43909 Migraine, unspecified, not intractable, without status migrainosus: Secondary | ICD-10-CM | POA: Insufficient documentation

## 2013-10-04 DIAGNOSIS — Z8679 Personal history of other diseases of the circulatory system: Secondary | ICD-10-CM | POA: Insufficient documentation

## 2013-10-04 LAB — URINALYSIS, ROUTINE W REFLEX MICROSCOPIC
BILIRUBIN URINE: NEGATIVE
GLUCOSE, UA: NEGATIVE mg/dL
KETONES UR: NEGATIVE mg/dL
Leukocytes, UA: NEGATIVE
Nitrite: NEGATIVE
PH: 6 (ref 5.0–8.0)
PROTEIN: NEGATIVE mg/dL
Specific Gravity, Urine: <1.005 — ABNORMAL LOW (ref 1.005–1.030)
Urobilinogen, UA: 0.2 mg/dL (ref 0.0–1.0)

## 2013-10-04 LAB — URINE MICROSCOPIC-ADD ON

## 2013-10-04 LAB — BASIC METABOLIC PANEL
BUN: 10 mg/dL (ref 6–23)
CO2: 26 meq/L (ref 19–32)
CREATININE: 0.87 mg/dL (ref 0.50–1.35)
Calcium: 9.1 mg/dL (ref 8.4–10.5)
Chloride: 103 mEq/L (ref 96–112)
GFR calc non Af Amer: >90 mL/min (ref >90)
Glucose, Bld: 89 mg/dL (ref 70–99)
POTASSIUM: 3.9 meq/L (ref 3.7–5.3)
Sodium: 139 mEq/L (ref 137–147)

## 2013-10-04 MED ORDER — OXYCODONE-ACETAMINOPHEN 5-325 MG PO TABS: 1.0000 | ORAL_TABLET | Freq: Four times a day (QID) | ORAL | Status: DC | PRN

## 2013-10-04 NOTE — Discharge Instructions (Signed)
Kidney Stones You can take ibuprofen 800 milligrams  (4 advil tablets) 3 times daily for pain with food or take the pain medicine prescribed for severe pain. Call [facility] to schedule  anoffice visit if you still have pain in a week. If pain is not well controlled but the medicine prescribed or if develop fever or vomiting, return to the emergency department Kidney stones (urolithiasis) are deposits that form inside your kidneys. The intense pain is caused by the stone moving through the urinary tract. When the stone moves, the ureter goes into spasm around the stone. The stone is usually passed in the urine.  CAUSES   A disorder that makes certain neck glands produce too much parathyroid hormone (primary hyperparathyroidism).  A buildup of uric acid crystals, similar to gout in your joints.  Narrowing (stricture) of the ureter.  A kidney obstruction present at birth (congenital obstruction).  Previous surgery on the kidney or ureters.  Numerous kidney infections. SYMPTOMS   Feeling sick to your stomach (nauseous).  Throwing up (vomiting).  Blood in the urine (hematuria).  Pain that usually spreads (radiates) to the groin.  Frequency or urgency of urination. DIAGNOSIS   Taking a history and physical exam.  Blood or urine tests.  CT scan.  Occasionally, an examination of the inside of the urinary bladder (cystoscopy) is performed. TREATMENT   Observation.  Increasing your fluid intake.  Extracorporeal shock wave lithotripsy This is a noninvasive procedure that uses shock waves to break up kidney stones.  Surgery may be needed if you have severe pain or persistent obstruction. There are various surgical procedures. Most of the procedures are performed with the use of small instruments. Only small incisions are needed to accommodate these instruments, so recovery time is minimized. The size, location, and chemical composition are all important variables that will  determine the proper choice of action for you. Talk to your health care provider to better understand your situation so that you will minimize the risk of injury to yourself and your kidney.  HOME CARE INSTRUCTIONS   Drink enough water and fluids to keep your urine clear or pale yellow. This will help you to pass the stone or stone fragments.  Strain all urine through the provided strainer. Keep all particulate matter and stones for your health care provider to see. The stone causing the pain may be as small as a grain of salt. It is very important to use the strainer each and every time you pass your urine. The collection of your stone will allow your health care provider to analyze it and verify that a stone has actually passed. The stone analysis will often identify what you can do to reduce the incidence of recurrences.  Only take over-the-counter or prescription medicines for pain, discomfort, or fever as directed by your health care provider.  Make a follow-up appointment with your health care provider as directed.  Get follow-up X-rays if required. The absence of pain does not always mean that the stone has passed. It may have only stopped moving. If the urine remains completely obstructed, it can cause loss of kidney function or even complete destruction of the kidney. It is your responsibility to make sure X-rays and follow-ups are completed. Ultrasounds of the kidney can show blockages and the status of the kidney. Ultrasounds are not associated with any radiation and can be performed easily in a matter of minutes. SEEK MEDICAL CARE IF:  You experience pain that is progressive and unresponsive to  any pain medicine you have been prescribed. SEEK IMMEDIATE MEDICAL CARE IF:   Pain cannot be controlled with the prescribed medicine.  You have a fever or shaking chills.  The severity or intensity of pain increases over 18 hours and is not relieved by pain medicine.  You develop a new onset  of abdominal pain.  You feel faint or pass out.  You are unable to urinate. MAKE SURE YOU:   Understand these instructions.  Will watch your condition.  Will get help right away if you are not doing well or get worse. Document Released: 05/01/2005 Document Revised: 01/01/2013 Document Reviewed: 10/02/2012 [facility] Patient Information 2014 [place], [place].

## 2013-10-04 NOTE — ED Notes (Signed)
Pt c/o right flank pain since Thursday with intermittent n/v.

## 2013-10-04 NOTE — ED Provider Notes (Addendum)
CSN: [id number]     Arrival date & time 10/04/13  1009 History  This chart was scribed for Nathaniel SouSam Ramonita Koenig, MD by Bronson CurbJacqueline Melvin, ED Scribe. This patient was seen in room APA04/APA04 and the patient's care was started at 10:59 AM.    Chief Complaint  Patient presents with  . Flank Pain     Patient is a 48 y.o. male presenting with flank pain. The history is provided by the patient. No language interpreter was used.  Flank Pain This is a recurrent problem. The current episode started 2 days ago. The problem has not changed since onset.Pertinent negatives include no chest pain, no headaches and no shortness of breath.   HPI Comments: Nathaniel Pope is a 48 y.o. male who presents to the Emergency Department complaining of intermittent right flank pain that began 2 days ago. Patient has history of kidney stones and suspects this to be the cause of his pain. Patient states he feels as if he has been "kicked in the groin". He reports the pain was 10/10 on 2 days ago. Today, patient states the pain has been a 6/10 at its worse, but is currently a 2 or 3. There is associated nausea and emesis. Patient states he plans to go on vacation on Tuesday and is concerned about "messing up his kidney". Patient reports he has not seen a urologist for this issue. Patient has history of atrial fibrillation, bicuspid aortic valve, heart murmur, and is currently taking ASA. He reports he quit smoking in 2002, and consumes alcohol on occasion. He denies any illegal substance use.  PCP: Dr. Sudie BaileyKnowlton but now goes to Urgent Care in [place].    Past Medical History  Diagnosis Date  . Migraines   . Atrial fibrillation   . Bicuspid aortic valve   . Heart murmur    Past Surgical History  Procedure Laterality Date  . Hernia repair    . Ruptured kidney wall    . Tonsillectomy     No family history on file. History  Substance Use Topics  . Smoking status: Former Games developermoker  . Smokeless tobacco: Not on file  .  Alcohol Use: Yes     Comment: rarely   no illicit drug  Review of Systems  Respiratory: Negative for shortness of breath.   Cardiovascular: Negative for chest pain.  Genitourinary: Positive for flank pain.  Neurological: Negative for headaches.  All other systems reviewed and are negative.     Allergies  Review of patient's allergies indicates no known allergies.  Home Medications   Prior to Admission medications   Medication Sig Start Date End Date Taking? Authorizing Provider  Aspirin-Salicylamide-Caffeine (BC HEADACHE) 325-95-16 MG TABS Take 1 packet by mouth daily as needed (for pain).    Historical Provider, MD  diclofenac (VOLTAREN) 75 MG EC tablet Take 1 tablet (75 mg total) by mouth 2 (two) times daily. 08/06/13   Peyton Najjaravid H Hopper, MD   Triage Vitals: BP 152/109  Pulse 70  Temp(Src) 97.9 F (36.6 C)  Resp 18  Ht 6' 2.5" (1.892 m)  Wt 210 lb (95.255 kg)  BMI 26.61 kg/m2  SpO2 100%  Physical Exam  Nursing note and vitals reviewed. Constitutional: He appears well-developed and well-nourished.  HENT:  Head: Normocephalic and atraumatic.  Eyes: Conjunctivae are normal. Pupils are equal, round, and reactive to light.  Neck: Neck supple. No tracheal deviation present. No thyromegaly present.  Cardiovascular: Normal rate and regular rhythm.   Murmur heard. 3/6 systolic ejection  Pulmonary/Chest: Effort normal and breath sounds normal.  Abdominal: Soft. Bowel sounds are normal. He exhibits no distension. There is no tenderness.  Musculoskeletal: Normal range of motion. He exhibits no edema and no tenderness.  Neurological: He is alert. Coordination normal.  Skin: Skin is warm and dry. No rash noted.  Psychiatric: He has a normal mood and affect.    ED Course  Procedures (including critical care time)   Labs Review Labs Reviewed  URINALYSIS, ROUTINE W REFLEX MICROSCOPIC - Abnormal; Notable for the following:    Color, Urine STRAW (*)    Specific Gravity, Urine  <1.005 (*)    Hgb urine dipstick LARGE (*)    All other components within normal limits  URINE MICROSCOPIC-ADD ON  BASIC METABOLIC PANEL    Imaging Review No results found.   EKG Interpretation None     Declines pain medicine presently  12:10 PM patient remains comfortable Results for orders placed during the hospital encounter of 10/04/13  URINALYSIS, ROUTINE W REFLEX MICROSCOPIC      Result Value Ref Range   Color, Urine STRAW (*) YELLOW   APPearance CLEAR  CLEAR   Specific Gravity, Urine <1.005 (*) 1.005 - 1.030   pH 6.0  5.0 - 8.0   Glucose, UA NEGATIVE  NEGATIVE mg/dL   Hgb urine dipstick LARGE (*) NEGATIVE   Bilirubin Urine NEGATIVE  NEGATIVE   Ketones, ur NEGATIVE  NEGATIVE mg/dL   Protein, ur NEGATIVE  NEGATIVE mg/dL   Urobilinogen, UA 0.2  0.0 - 1.0 mg/dL   Nitrite NEGATIVE  NEGATIVE   Leukocytes, UA NEGATIVE  NEGATIVE  URINE MICROSCOPIC-ADD ON      Result Value Ref Range   Squamous Epithelial / LPF RARE  RARE   WBC, UA 0-2  <3 WBC/hpf   RBC / HPF 11-20  <3 RBC/hpf   Bacteria, UA RARE  RARE  BASIC METABOLIC PANEL      Result Value Ref Range   Sodium 139  137 - 147 mEq/L   Potassium 3.9  3.7 - 5.3 mEq/L   Chloride 103  96 - 112 mEq/L   CO2 26  19 - 32 mEq/L   Glucose, Bld 89  70 - 99 mg/dL   BUN 10  6 - 23 mg/dL   Creatinine, Ser 3.41  0.50 - 1.35 mg/dL   Calcium 9.1  8.4 - 93.7 mg/dL   GFR calc non Af Amer >90  >90 mL/min   GFR calc Af Amer >90  >90 mL/min   No results found.  MDM   Final diagnoses:  None   patient requests referral to urologist in [place]. Will refer to [facility]. Plan prescription Percocet, Advil for mild pain. Diagnosis ureteral colic      I personally performed the services described in this documentation, which was scribed in my presence. The recorded information has been reviewed and considered.  Nathaniel Sou, MD 10/04/13 1215  Nathaniel Sou, MD 10/04/13 1215

## 2013-10-26 ENCOUNTER — Encounter (HOSPITAL_COMMUNITY): Payer: Self-pay | Admitting: Emergency Medicine

## 2013-10-26 ENCOUNTER — Emergency Department (HOSPITAL_COMMUNITY): Payer: BC Managed Care – PPO

## 2013-10-26 ENCOUNTER — Emergency Department (HOSPITAL_COMMUNITY)
Admission: EM | Admit: 2013-10-26 | Discharge: 2013-10-26 | Disposition: A | Discharge status: Home / Self Care | Payer: BC Managed Care – PPO | Attending: Emergency Medicine | Admitting: Emergency Medicine

## 2013-10-26 DIAGNOSIS — Z8679 Personal history of other diseases of the circulatory system: Secondary | ICD-10-CM | POA: Insufficient documentation

## 2013-10-26 DIAGNOSIS — N200 Calculus of kidney: Secondary | ICD-10-CM | POA: Insufficient documentation

## 2013-10-26 DIAGNOSIS — R011 Cardiac murmur, unspecified: Secondary | ICD-10-CM | POA: Insufficient documentation

## 2013-10-26 DIAGNOSIS — Q231 Congenital insufficiency of aortic valve: Secondary | ICD-10-CM | POA: Insufficient documentation

## 2013-10-26 DIAGNOSIS — R319 Hematuria, unspecified: Secondary | ICD-10-CM

## 2013-10-26 DIAGNOSIS — Z9889 Other specified postprocedural states: Secondary | ICD-10-CM | POA: Insufficient documentation

## 2013-10-26 DIAGNOSIS — Z87891 Personal history of nicotine dependence: Secondary | ICD-10-CM | POA: Insufficient documentation

## 2013-10-26 HISTORY — DX: Calculus of kidney: N20.0

## 2013-10-26 LAB — URINALYSIS, ROUTINE W REFLEX MICROSCOPIC
Bilirubin Urine: NEGATIVE
Glucose, UA: NEGATIVE mg/dL
Leukocytes, UA: NEGATIVE
NITRITE: NEGATIVE
Protein, ur: 100 mg/dL — AB
Specific Gravity, Urine: >1.03 — ABNORMAL HIGH (ref 1.005–1.030)
Urobilinogen, UA: 0.2 mg/dL (ref 0.0–1.0)
pH: 6.5 (ref 5.0–8.0)

## 2013-10-26 LAB — CBC WITH DIFFERENTIAL/PLATELET
Basophils Absolute: 0 10*3/uL (ref 0.0–0.1)
Basophils Relative: 1 % (ref 0–1)
Eosinophils Absolute: 0.1 10*3/uL (ref 0.0–0.7)
Eosinophils Relative: 3 % (ref 0–5)
HCT: 41.1 % (ref 39.0–52.0)
Hemoglobin: 14.3 g/dL (ref 13.0–17.0)
Lymphocytes Relative: 35 % (ref 12–46)
Lymphs Abs: 1.5 10*3/uL (ref 0.7–4.0)
MCH: 30.3 pg (ref 26.0–34.0)
MCHC: 34.8 g/dL (ref 30.0–36.0)
MCV: 87.1 fL (ref 78.0–100.0)
Monocytes Absolute: 0.3 10*3/uL (ref 0.1–1.0)
Monocytes Relative: 6 % (ref 3–12)
Neutro Abs: 2.5 10*3/uL (ref 1.7–7.7)
Neutrophils Relative %: 55 % (ref 43–77)
Platelets: 219 10*3/uL (ref 150–400)
RBC: 4.72 MIL/uL (ref 4.22–5.81)
RDW: 12.4 % (ref 11.5–15.5)
WBC: 4.4 10*3/uL (ref 4.0–10.5)

## 2013-10-26 LAB — BASIC METABOLIC PANEL
BUN: 12 mg/dL (ref 6–23)
CO2: 26 mEq/L (ref 19–32)
Calcium: 9.6 mg/dL (ref 8.4–10.5)
Chloride: 103 mEq/L (ref 96–112)
Creatinine, Ser: 0.97 mg/dL (ref 0.50–1.35)
GFR calc Af Amer: >90 mL/min (ref >90)
GFR calc non Af Amer: >90 mL/min (ref >90)
Glucose, Bld: 86 mg/dL (ref 70–99)
Potassium: 4 mEq/L (ref 3.7–5.3)
Sodium: 141 mEq/L (ref 137–147)

## 2013-10-26 LAB — URINE MICROSCOPIC-ADD ON

## 2013-10-26 MED ORDER — OXYCODONE-ACETAMINOPHEN 5-325 MG PO TABS: 2.0000 | ORAL_TABLET | ORAL | Status: DC | PRN

## 2013-10-26 NOTE — ED Notes (Signed)
Patient states "I think I passed about kidney stones 4 days ago." Patient now reports bleeding with urination. Per patient passing blood clots in urine yesterday and today a constant stream of blood. Patient also reports some left flank pain. Hx of kidney stones.

## 2013-10-26 NOTE — ED Provider Notes (Signed)
Patient seen by Dr. Juleen ChinaKohut.  Dr. Marylen PontoKohut's shift ended and went home without writing prescription for pain meds.  Will prescribe percocet (20).  Geoffery Lyonsouglas Katrell Milhorn, MD 10/26/13 [phone]

## 2013-10-26 NOTE — ED Notes (Signed)
Patient with no complaints at this time. Respirations even and unlabored. Skin warm/dry. Discharge instructions reviewed with patient at this time. Patient given opportunity to voice concerns/ask questions. Patient discharged at this time and left Emergency Department with steady gait.   

## 2013-10-26 NOTE — ED Notes (Addendum)
Nagging pain in L flank and bilateral groin.  Denies n/v/d.  Urine w/frank blood.

## 2013-10-26 NOTE — Discharge Instructions (Signed)
Kidney Stones Kidney stones (urolithiasis) are solid masses that form inside your kidneys. The intense pain is caused by the stone moving through the kidney, ureter, bladder, and urethra (urinary tract). When the stone moves, the ureter starts to spasm around the stone. The stone is usually passed in your pee (urine).  HOME CARE  Drink enough fluids to keep your pee clear or pale yellow. This helps to get the stone out.  Strain all pee through the provided strainer. Do not pee without peeing through the strainer, not even once. If you pee the stone out, catch it in the strainer. The stone may be as small as a grain of salt. Take this to your doctor. This will help your doctor figure out what you can do to try to prevent more kidney stones.  Only take medicine as told by your doctor.  Follow up with your doctor as told.  Get follow-up X-rays as told by your doctor. GET HELP IF: You have pain that gets worse even if you have been taking pain medicine. GET HELP RIGHT AWAY IF:   Your pain does not get better with medicine.  You have a fever or shaking chills.  Your pain increases and gets worse over 18 hours.  You have new belly (abdominal) pain.  You feel faint or pass out.  You are unable to pee. MAKE SURE YOU:   Understand these instructions.  Will watch your condition.  Will get help right away if you are not doing well or get worse. Document Released: 10/18/2007 Document Revised: 01/01/2013 Document Reviewed: 10/02/2012 [facility] Patient Information 2014 [employer], [place].  Hematuria, Adult Hematuria is blood in your urine. It can be caused by a bladder infection, kidney infection, prostate infection, kidney stone, or cancer of your urinary tract. Infections can usually be treated with medicine, and a kidney stone usually will pass through your urine. If neither of these is the cause of your hematuria, further workup to find out the reason may be needed. It is very important  that you tell your health care provider about any blood you see in your urine, even if the blood stops without treatment or happens without causing pain. Blood in your urine that happens and then stops and then happens again can be a symptom of a very serious condition. Also, pain is not a symptom in the initial stages of many urinary cancers. HOME CARE INSTRUCTIONS   Drink lots of fluid, 3 4 quarts a day. If you have been diagnosed with an infection, cranberry juice is especially recommended, in addition to large amounts of water.  Avoid caffeine, tea, and carbonated beverages, because they tend to irritate the bladder.  Avoid alcohol because it may irritate the prostate.  Only take over-the-counter or prescription medicines for pain, discomfort, or fever as directed by your health care provider.  If you have been diagnosed with a kidney stone, follow your health care provider's instructions regarding straining your urine to catch the stone.  Empty your bladder often. Avoid holding urine for long periods of time.  After a bowel movement, women should cleanse front to back. Use each tissue only once.  Empty your bladder before and after sexual intercourse if you are a male. SEEK MEDICAL CARE IF: You develop back pain, fever, a feeling of sickness in your stomach (nausea), or vomiting or if your symptoms are not better in 3 days. Return sooner if you are getting worse. SEEK IMMEDIATE MEDICAL CARE IF:   You have a  persistent fever, with a temperature of 101.48F (38.8C) or greater.  You develop severe vomiting and are unable to keep the medicine down.  You develop severe back or abdominal pain despite taking your medicines.  You begin passing a large amount of blood or clots in your urine.  You feel extremely weak or faint, or you pass out. MAKE SURE YOU:   Understand these instructions.  Will watch your condition.  Will get help right away if you are not doing well or get  worse. Document Released: 05/01/2005 Document Revised: 02/19/2013 Document Reviewed: 12/30/2012 [facility] Patient Information 2014 [employer], [place].

## 2013-10-31 NOTE — ED Provider Notes (Signed)
CSN: [id number]     Arrival date & time 10/26/13  1327 History   First MD Initiated Contact with Patient 10/26/13 1336     Chief Complaint  Patient presents with  . Hematuria     (Consider location/radiation/quality/duration/timing/severity/associated sxs/prior Treatment) HPI  48 year old male with left flank pain and hematuria. Symptom onset about 4 days ago. Occasional clots. Denies any difficulty voiding. No dysuria. No blood thinning medications. Past history of ureteral stones. No dizziness, lightheadedness or shortness of breath.  Past Medical History  Diagnosis Date  . Migraines   . Atrial fibrillation   . Bicuspid aortic valve   . Heart murmur   . Kidney stones    Past Surgical History  Procedure Laterality Date  . Hernia repair    . Ruptured kidney wall    . Tonsillectomy     Family History  Problem Relation Age of Onset  . Diabetes Mother   . Cancer Father    History  Substance Use Topics  . Smoking status: Former Smoker -- 1.50 packs/day for 15 years    Types: Cigarettes    Quit date: 05/29/1998  . Smokeless tobacco: Never Used  . Alcohol Use: Yes     Comment: rarely    Review of Systems  All systems reviewed and negative, other than as noted in HPI.   Allergies  Review of patient's allergies indicates no known allergies.  Home Medications   Prior to Admission medications   Medication Sig Start Date End Date Taking? Authorizing Provider  oxyCODONE-acetaminophen (PERCOCET) 5-325 MG per tablet Take 2 tablets by mouth every 4 (four) hours as needed. 10/26/13   Geoffery Lyonsouglas Delo, MD   BP 125/89  Temp(Src) 98.1 F (36.7 C) (Oral)  Resp 18  Ht 6\' 2"  (1.88 m)  Wt 220 lb (99.791 kg)  BMI 28.23 kg/m2  SpO2 96% Physical Exam  Nursing note and vitals reviewed. Constitutional: He appears well-developed and well-nourished. No distress.  HENT:  Head: Normocephalic and atraumatic.  Eyes: Conjunctivae are normal. Right eye exhibits no discharge. Left eye  exhibits no discharge.  Neck: Neck supple.  Cardiovascular: Normal rate, regular rhythm and normal heart sounds.  Exam reveals no gallop and no friction rub.   No murmur heard. Pulmonary/Chest: Effort normal and breath sounds normal. No respiratory distress.  Abdominal: Soft. He exhibits no distension. There is no tenderness.  Genitourinary:  L CVA tenderness  Musculoskeletal: He exhibits no edema and no tenderness.  Neurological: He is alert.  Skin: Skin is warm and dry.  Psychiatric: He has a normal mood and affect. His behavior is normal. Thought content normal.    ED Course  Procedures (including critical care time) Labs Review Labs Reviewed  URINALYSIS, ROUTINE W REFLEX MICROSCOPIC - Abnormal; Notable for the following:    Color, Urine RED (*)    APPearance CLOUDY (*)    Specific Gravity, Urine >1.030 (*)    Hgb urine dipstick LARGE (*)    Ketones, ur TRACE (*)    Protein, ur 100 (*)    All other components within normal limits  URINE MICROSCOPIC-ADD ON  BASIC METABOLIC PANEL  CBC WITH DIFFERENTIAL    Imaging Review No results found.  Ct Abdomen Pelvis Wo Contrast  10/26/2013   CLINICAL DATA:  Hematuria, left flank pain  EXAM: CT ABDOMEN AND PELVIS WITHOUT CONTRAST  TECHNIQUE: Multidetector CT imaging of the abdomen and pelvis was performed following the standard protocol without IV contrast.  COMPARISON:  Report 12/23/2000 no images available.  FINDINGS: Lung bases are unremarkable. Sagittal images of the spine shows anterior spurring lower endplate of L2 and L4 vertebral body.  Unenhanced liver shows no biliary ductal dilatation. A small calcified gallstone within gallbladder measures 4 mm.  There is a calcified nonobstructive calculus in upper pole of the left kidney measures 1.3 cm. No hydronephrosis or hydroureter. There is minimal distension of proximal right ureter without frank hydronephrosis. In axial image 61 there is partial obstructive calculus in distal right  ureter measures 3 mm at the level of upper endplate of S1. Bilateral distal ureter is unremarkable.  No small bowel obstruction. No ascites or free air. No adenopathy. Normal appendix. No pericecal inflammation. No calcified calculi are noted within eye bladder. Small prostate gland calcifications are noted. The seminal vesicles are unremarkable. Small nonspecific bilateral inguinal lymph nodes.  IMPRESSION: 1. There is partially obstructive calculus in distal right ureter measures 3 mm at the level of upper endplate of S1. No frank right hydronephrosis. Mild right periureteral stranding at this level. 2. Nonobstructive calcified calculus in upper pole of the left kidney measures 1.3 cm. 3. Small calcified gallstone within gallbladder measures 4 mm. 4. Normal appendix.  No pericecal inflammation. 5. No calcified calculi are noted within urinary bladder.   Electronically Signed   By: Natasha MeadLiviu  Pop M.D.   On: 10/26/2013 14:58    EKG Interpretation None      MDM   Final diagnoses:  Hematuria  Renal stones    48 year old male with flank pain and hematuria. CT confirms ureteral stone. Hematuria, but voiding without difficulty. Renal function is normal. Plan symptomatic treatment. Return cautions were discussed. Urology followup otherwise.    Raeford RazorStephen Kohut, MD 10/31/13 [phone]

## 2015-03-21 IMAGING — CT CT HEAD W/O CM
1 series · 16 of 30 positions shown, 20 images · non-contrast
Comparison: None.

CLINICAL DATA: Migraine.

EXAM:
CT HEAD WITHOUT CONTRAST
TECHNIQUE: Contiguous axial images were obtained from the base of the skull
through the vertex without intravenous contrast.

[Series 2: headseq 4.8 h37s · axial · 0.45mm/px · z∈[+73,+231]mm · 16 of 36 slices shown, 20 images]
[im 2/36  brain]
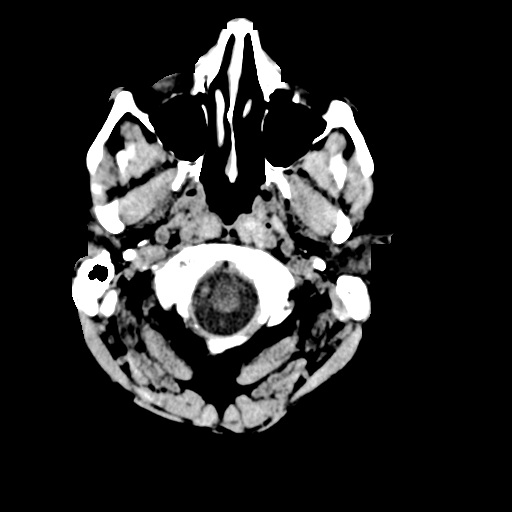
[im 2/36  bone]
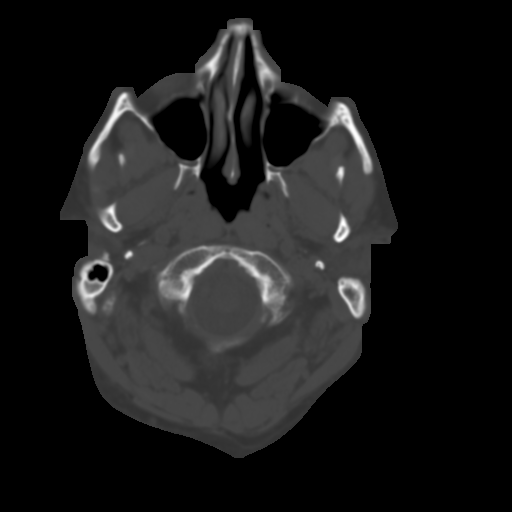
[im 4/36  brain]
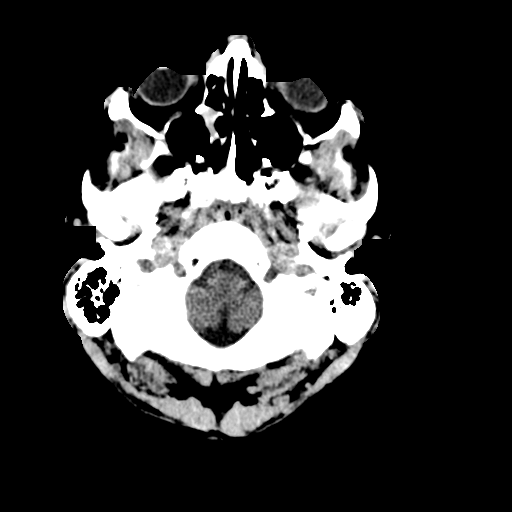
[im 7/36  brain]
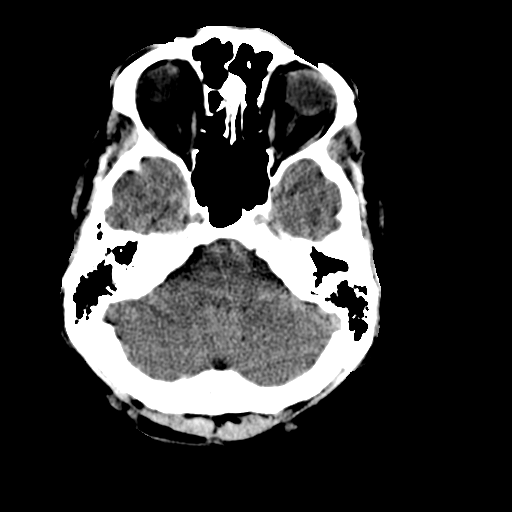
[im 9/36  brain]
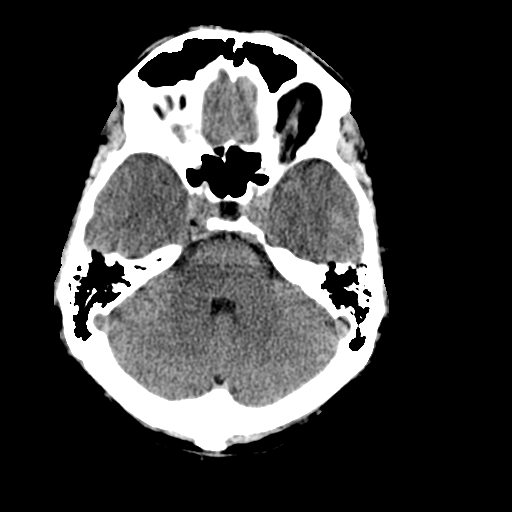
[im 10/36  brain]
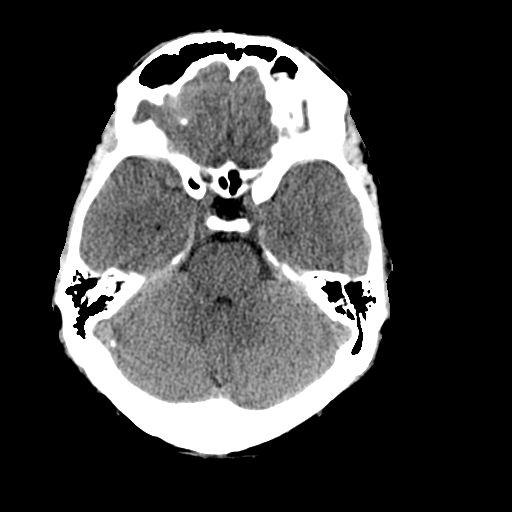
[im 10/36  bone]
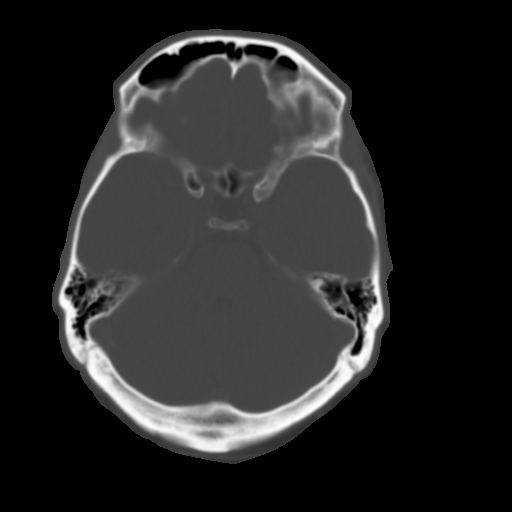
[im 13/36  brain]
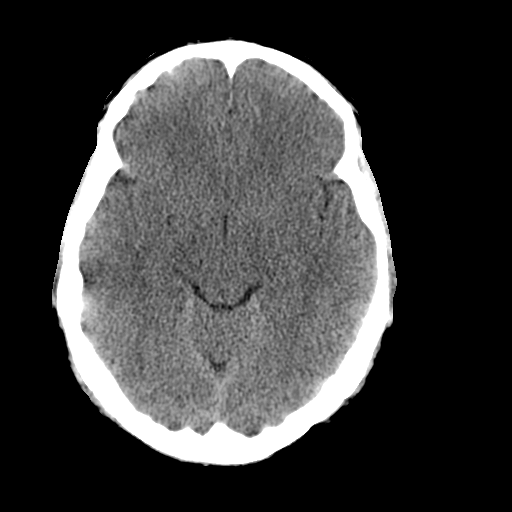
[im 15/36  brain]
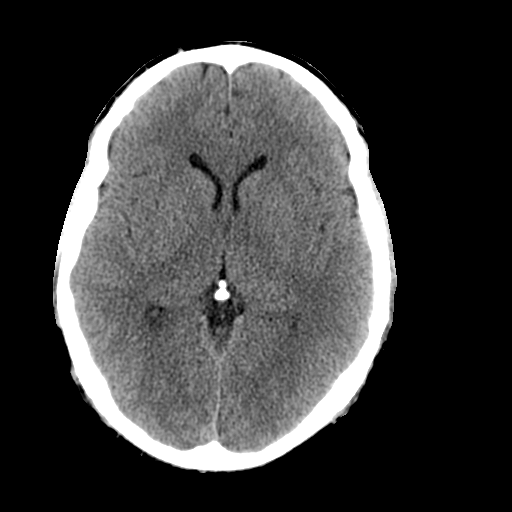
[im 17/36  brain]
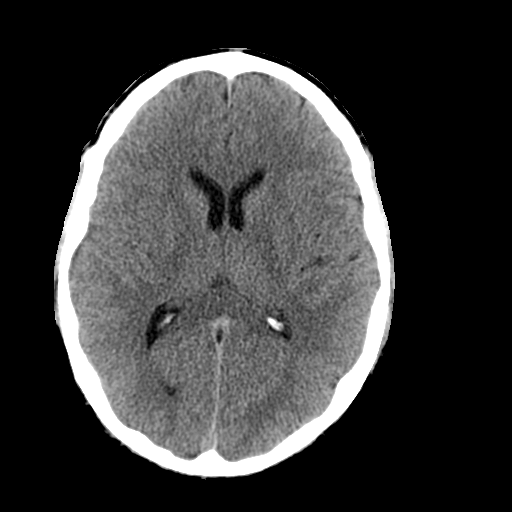
[im 19/36  brain]
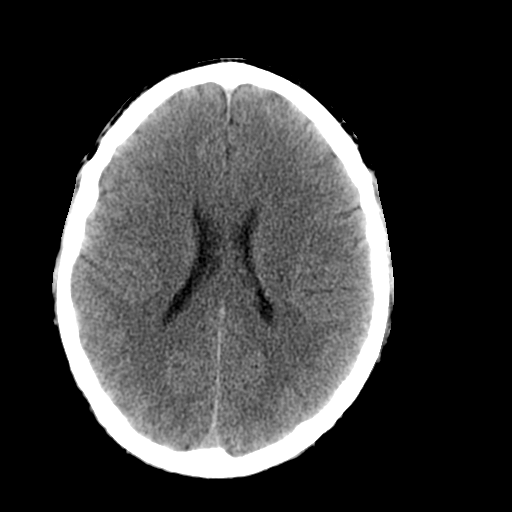
[im 19/36  bone]
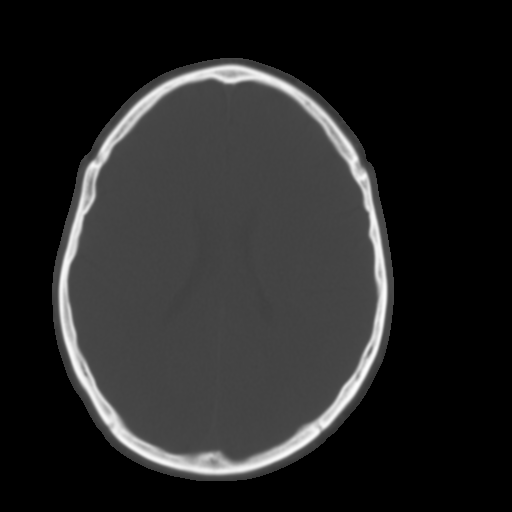
[im 21/36  brain]
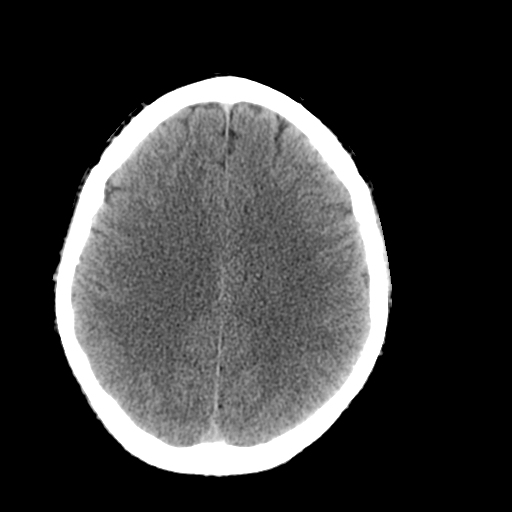
[im 23/36  brain]
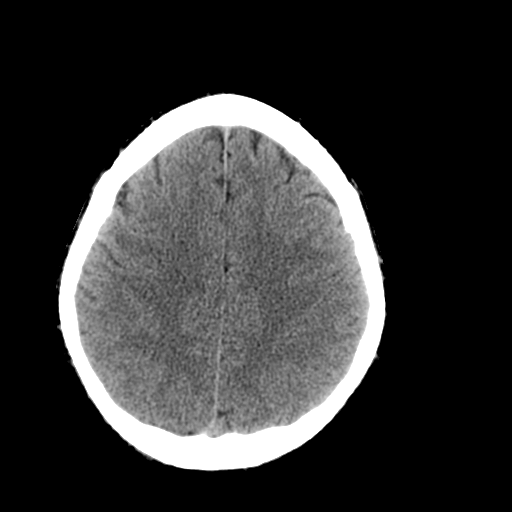
[im 26/36  brain]
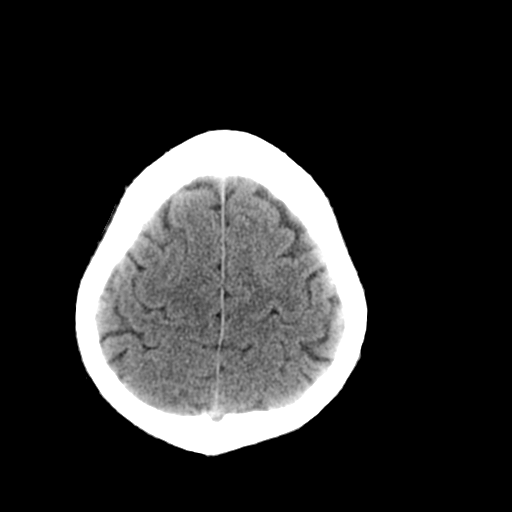
[im 27/36  brain]
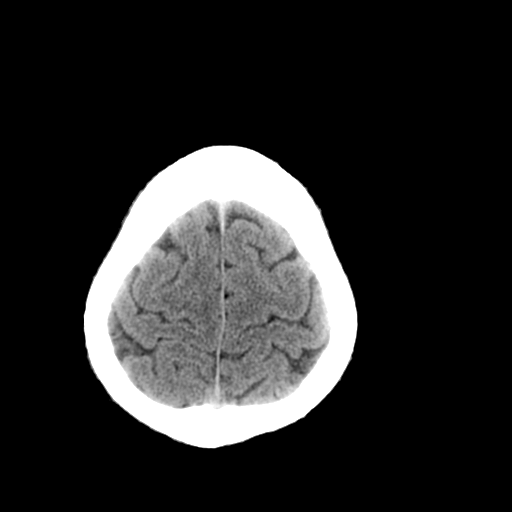
[im 27/36  bone]
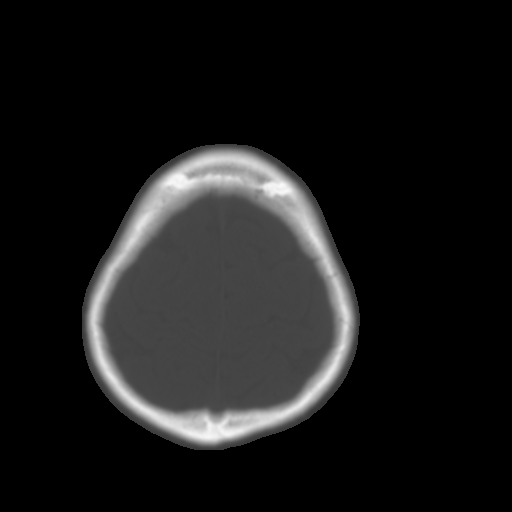
[im 29/36  brain]
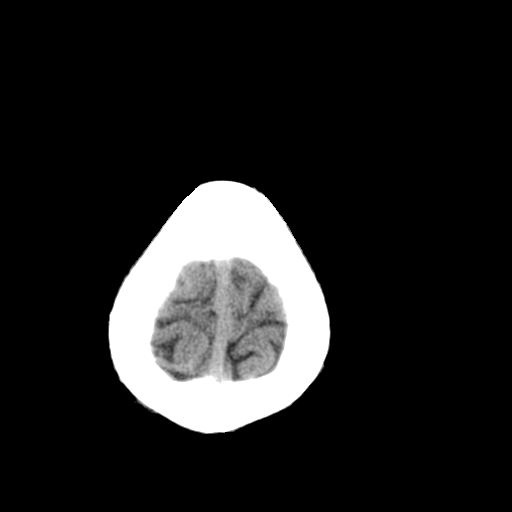
[im 32/36  brain]
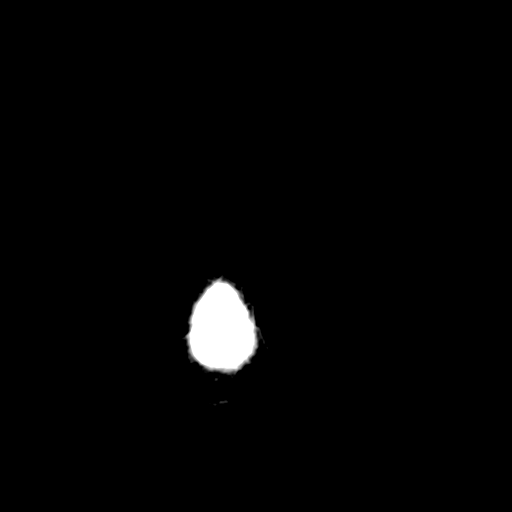
[im 34/36  brain]
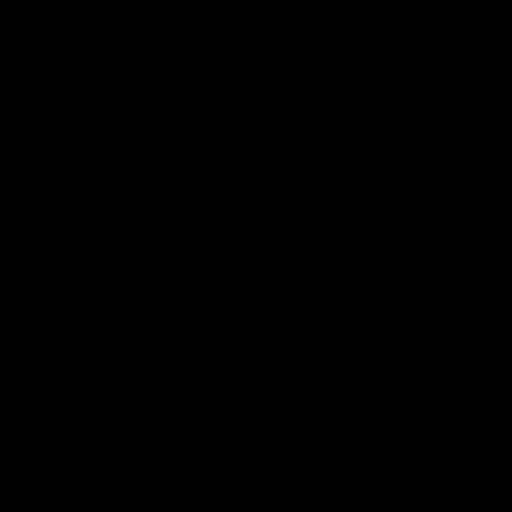

[16 of 30 positions shown; findings below may reference images not displayed]

FINDINGS: Skull and Sinuses:No significant abnormality.

Orbits: No acute abnormality.

Brain: No evidence of acute abnormality, such as acute infarction,
hemorrhage, hydrocephalus, or mass lesion/mass effect.
IMPRESSION: No evidence of acute intracranial disease.

## 2015-05-24 ENCOUNTER — Observation Stay (HOSPITAL_COMMUNITY): Payer: BLUE CROSS/BLUE SHIELD

## 2015-05-24 ENCOUNTER — Encounter (HOSPITAL_COMMUNITY): Payer: Self-pay | Admitting: *Deleted

## 2015-05-24 ENCOUNTER — Encounter: Payer: Self-pay | Admitting: Ophthalmology

## 2015-05-24 ENCOUNTER — Observation Stay (HOSPITAL_COMMUNITY)
Admission: EM | Admit: 2015-05-24 | Discharge: 2015-05-25 | Disposition: A | Discharge status: Home / Self Care | Payer: BLUE CROSS/BLUE SHIELD | Attending: Internal Medicine | Admitting: Internal Medicine

## 2015-05-24 DIAGNOSIS — I6523 Occlusion and stenosis of bilateral carotid arteries: Secondary | ICD-10-CM | POA: Insufficient documentation

## 2015-05-24 DIAGNOSIS — G43909 Migraine, unspecified, not intractable, without status migrainosus: Secondary | ICD-10-CM | POA: Insufficient documentation

## 2015-05-24 DIAGNOSIS — Z7982 Long term (current) use of aspirin: Secondary | ICD-10-CM | POA: Insufficient documentation

## 2015-05-24 DIAGNOSIS — G8929 Other chronic pain: Secondary | ICD-10-CM | POA: Diagnosis not present

## 2015-05-24 DIAGNOSIS — I4891 Unspecified atrial fibrillation: Secondary | ICD-10-CM | POA: Insufficient documentation

## 2015-05-24 DIAGNOSIS — I10 Essential (primary) hypertension: Secondary | ICD-10-CM | POA: Diagnosis not present

## 2015-05-24 DIAGNOSIS — Q231 Congenital insufficiency of aortic valve: Secondary | ICD-10-CM | POA: Diagnosis not present

## 2015-05-24 DIAGNOSIS — H547 Unspecified visual loss: Secondary | ICD-10-CM | POA: Diagnosis not present

## 2015-05-24 DIAGNOSIS — I351 Nonrheumatic aortic (valve) insufficiency: Secondary | ICD-10-CM | POA: Diagnosis not present

## 2015-05-24 DIAGNOSIS — Z87442 Personal history of urinary calculi: Secondary | ICD-10-CM | POA: Diagnosis not present

## 2015-05-24 DIAGNOSIS — Z833 Family history of diabetes mellitus: Secondary | ICD-10-CM | POA: Diagnosis not present

## 2015-05-24 DIAGNOSIS — Z7901 Long term (current) use of anticoagulants: Secondary | ICD-10-CM | POA: Insufficient documentation

## 2015-05-24 DIAGNOSIS — I639 Cerebral infarction, unspecified: Secondary | ICD-10-CM

## 2015-05-24 DIAGNOSIS — I480 Paroxysmal atrial fibrillation: Secondary | ICD-10-CM | POA: Insufficient documentation

## 2015-05-24 DIAGNOSIS — R2990 Unspecified symptoms and signs involving the nervous system: Secondary | ICD-10-CM | POA: Diagnosis present

## 2015-05-24 DIAGNOSIS — R011 Cardiac murmur, unspecified: Secondary | ICD-10-CM | POA: Diagnosis not present

## 2015-05-24 DIAGNOSIS — F17210 Nicotine dependence, cigarettes, uncomplicated: Secondary | ICD-10-CM | POA: Diagnosis not present

## 2015-05-24 DIAGNOSIS — E785 Hyperlipidemia, unspecified: Secondary | ICD-10-CM | POA: Insufficient documentation

## 2015-05-24 DIAGNOSIS — H34232 Retinal artery branch occlusion, left eye: Principal | ICD-10-CM | POA: Diagnosis present

## 2015-05-24 DIAGNOSIS — I071 Rheumatic tricuspid insufficiency: Secondary | ICD-10-CM | POA: Insufficient documentation

## 2015-05-24 DIAGNOSIS — R519 Headache, unspecified: Secondary | ICD-10-CM | POA: Diagnosis present

## 2015-05-24 DIAGNOSIS — H538 Other visual disturbances: Secondary | ICD-10-CM | POA: Diagnosis not present

## 2015-05-24 DIAGNOSIS — R51 Headache: Secondary | ICD-10-CM

## 2015-05-24 DIAGNOSIS — Z809 Family history of malignant neoplasm, unspecified: Secondary | ICD-10-CM | POA: Insufficient documentation

## 2015-05-24 DIAGNOSIS — G43009 Migraine without aura, not intractable, without status migrainosus: Secondary | ICD-10-CM | POA: Insufficient documentation

## 2015-05-24 DIAGNOSIS — I6330 Cerebral infarction due to thrombosis of unspecified cerebral artery: Secondary | ICD-10-CM | POA: Insufficient documentation

## 2015-05-24 HISTORY — DX: Essential (primary) hypertension: I10

## 2015-05-24 HISTORY — DX: Retinal artery branch occlusion, unspecified eye: H34.239

## 2015-05-24 LAB — CBC
HCT: 46.1 % (ref 39.0–52.0)
HEMOGLOBIN: 15.5 g/dL (ref 13.0–17.0)
MCH: 30.7 pg (ref 26.0–34.0)
MCHC: 33.6 g/dL (ref 30.0–36.0)
MCV: 91.3 fL (ref 78.0–100.0)
Platelets: 230 10*3/uL (ref 150–400)
RBC: 5.05 MIL/uL (ref 4.22–5.81)
RDW: 12.4 % (ref 11.5–15.5)
WBC: 5.4 10*3/uL (ref 4.0–10.5)

## 2015-05-24 LAB — URINALYSIS, ROUTINE W REFLEX MICROSCOPIC
BILIRUBIN URINE: NEGATIVE
Glucose, UA: NEGATIVE mg/dL
KETONES UR: NEGATIVE mg/dL
NITRITE: NEGATIVE
Protein, ur: NEGATIVE mg/dL
SPECIFIC GRAVITY, URINE: 1.008 (ref 1.005–1.030)
pH: 7 (ref 5.0–8.0)

## 2015-05-24 LAB — I-STAT CHEM 8, ED
BUN: 10 mg/dL (ref 6–20)
CALCIUM ION: 1.16 mmol/L (ref 1.12–1.23)
CHLORIDE: 104 mmol/L (ref 101–111)
Creatinine, Ser: 0.9 mg/dL (ref 0.61–1.24)
GLUCOSE: 86 mg/dL (ref 65–99)
HCT: 46 % (ref 39.0–52.0)
HEMOGLOBIN: 15.6 g/dL (ref 13.0–17.0)
Potassium: 3.9 mmol/L (ref 3.5–5.1)
Sodium: 142 mmol/L (ref 135–145)
TCO2: 26 mmol/L (ref 0–100)

## 2015-05-24 LAB — BASIC METABOLIC PANEL
ANION GAP: 12 (ref 5–15)
BUN: 8 mg/dL (ref 6–20)
CHLORIDE: 105 mmol/L (ref 101–111)
CO2: 23 mmol/L (ref 22–32)
Calcium: 9.5 mg/dL (ref 8.9–10.3)
Creatinine, Ser: 1 mg/dL (ref 0.61–1.24)
GFR calc Af Amer: >60 mL/min (ref >60)
Glucose, Bld: 127 mg/dL — ABNORMAL HIGH (ref 65–99)
POTASSIUM: 4 mmol/L (ref 3.5–5.1)
SODIUM: 140 mmol/L (ref 135–145)

## 2015-05-24 LAB — HEPATIC FUNCTION PANEL
ALBUMIN: 3.9 g/dL (ref 3.5–5.0)
ALK PHOS: 80 U/L (ref 38–126)
ALT: 41 U/L (ref 17–63)
AST: 23 U/L (ref 15–41)
Bilirubin, Direct: 0.1 mg/dL (ref 0.1–0.5)
Indirect Bilirubin: 0.4 mg/dL (ref 0.3–0.9)
TOTAL PROTEIN: 7 g/dL (ref 6.5–8.1)
Total Bilirubin: 0.5 mg/dL (ref 0.3–1.2)

## 2015-05-24 LAB — PROTIME-INR
INR: 1.05 (ref 0.00–1.49)
Prothrombin Time: 13.9 seconds (ref 11.6–15.2)

## 2015-05-24 LAB — APTT: APTT: 30 s (ref 24–37)

## 2015-05-24 LAB — RAPID URINE DRUG SCREEN, HOSP PERFORMED
AMPHETAMINES: NOT DETECTED
Barbiturates: NOT DETECTED
Benzodiazepines: NOT DETECTED
Cocaine: NOT DETECTED
Opiates: NOT DETECTED
Tetrahydrocannabinol: NOT DETECTED

## 2015-05-24 LAB — C-REACTIVE PROTEIN: CRP: 1.1 mg/dL — AB (ref <1.0)

## 2015-05-24 LAB — LIPID PANEL
CHOL/HDL RATIO: 6.6 ratio
CHOLESTEROL: 279 mg/dL — AB (ref 0–200)
HDL: 42 mg/dL (ref >40)
LDL CALC: 174 mg/dL — AB (ref 0–99)
TRIGLYCERIDES: 313 mg/dL — AB (ref <150)
VLDL: 63 mg/dL — AB (ref 0–40)

## 2015-05-24 LAB — URINE MICROSCOPIC-ADD ON

## 2015-05-24 LAB — I-STAT TROPONIN, ED: Troponin i, poc: 0 ng/mL (ref 0.00–0.08)

## 2015-05-24 LAB — SEDIMENTATION RATE: SED RATE: 13 mm/h (ref 0–16)

## 2015-05-24 LAB — ETHANOL

## 2015-05-24 MED ORDER — ENOXAPARIN SODIUM 30 MG/0.3ML ~~LOC~~ SOLN
30.0000 mg | SUBCUTANEOUS | Status: DC
  Administered 2015-05-24: 30 mg via SUBCUTANEOUS
  Filled 2015-05-24: qty 0.3

## 2015-05-24 MED ORDER — ACETAMINOPHEN 650 MG RE SUPP: 650.0000 mg | RECTAL | Status: DC | PRN

## 2015-05-24 MED ORDER — SENNOSIDES-DOCUSATE SODIUM 8.6-50 MG PO TABS: 1.0000 | ORAL_TABLET | Freq: Every evening | ORAL | Status: DC | PRN

## 2015-05-24 MED ORDER — STROKE: EARLY STAGES OF RECOVERY BOOK
Freq: Once | Status: AC
  Administered 2015-05-24: 23:00:00

## 2015-05-24 MED ORDER — ACETAMINOPHEN 325 MG PO TABS: 650.0000 mg | ORAL_TABLET | ORAL | Status: DC | PRN

## 2015-05-24 NOTE — Progress Notes (Signed)
Patient ID: Jerrol Bananadward S Syme, male   DOB: Feb 27, 1966, 50 y.o.   MRN: [id number] 50 y.o. M with h/o paroxysmal afib presented to eye clinic with acute painless vision loss in inferior field of vision left eye. 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  Recommended patient go the emergency room for stroke work up.  Paient will need HbgA1c, CBC, PT/PTT, HLD, ANA, RF.  I would also recommend he have an MRI brain looking for acute stroke. He will need carotid artery ultrasound, ECG, and cardiac echocardiogram looking for embolic source.      If you have any questions or concerns, please do not hesitate to contact me.     Mack Hookndrew Shamone Winzer ([phone] (c): [phone]

## 2015-05-24 NOTE — ED Notes (Addendum)
Please see opthamology note in EPIC, opthamology sent for retinal artery occlusion found in office today

## 2015-05-24 NOTE — Consult Note (Addendum)
Stroke Consult Consulting Physician: Dr Regenia Skeeter  Chief Complaint: visual field deficits  HPI: Nathaniel Pope is an 50 y.o. male hx of A fib, HTN referred from eye doctor for stroke workup. He reports that 2 days ago he noted a sudden deficit in his left eye. Notes that there is around 25% of the middle part of his visual field in the left eye that he cannot see out of. Denies any floaters, eye pain. Went to his ophthalmologist and was diagnosed with a retinal artery occlusion. He does note a history of daily headache. Denies any speech, motor or sensory deficits. Was diagnosed with A fib around 10 years ago, currently not on any anticoagulation. Reports initially being on blood thinner but stopped after he was told he did not need it as his a fib resolved.   MRI head imaging reviewed, no acute infarct.   Date last known well: 1/07 Time last known well: unclear  tPA Given: no, outside tPA window Modified Rankin: Rankin Score=0  Past Medical History  Diagnosis Date  . Migraines   . Atrial fibrillation ([place])   . Bicuspid aortic valve   . Heart murmur   . Kidney stones   . Retinal artery occlusion, branch     05/2015  . HTN (hypertension)     Past Surgical History  Procedure Laterality Date  . Hernia repair    . Ruptured kidney wall    . Tonsillectomy      Family History  Problem Relation Age of Onset  . Diabetes Mother   . Cancer Father    Social History:  reports that he quit smoking about 16 years ago. His smoking use included Cigarettes. He has a 22.5 pack-year smoking history. He has never used smokeless tobacco. He reports that he drinks alcohol. He reports that he does not use illicit drugs.  Allergies: No Known Allergies  Medications Prior to Admission  Medication Sig Dispense Refill  . Aspirin-Acetaminophen-Caffeine (GOODYS EXTRA STRENGTH) 260-130-16 MG TABS Take 1 packet by mouth every 6 (six) hours as needed (pain).      ROS: Out of a complete 14 system  review, the patient complains of only the following symptoms, and all other reviewed systems are negative. +visual field deficits  Physical Examination: Filed Vitals:   05/24/15 1634 05/24/15 1835  BP: 121/95 129/97  Pulse: 72 70  Temp:  98 F (36.7 C)  Resp: 12 14   Physical Exam  Constitutional: He appears well-developed and well-nourished.  Psych: Affect appropriate to situation Eyes: No scleral injection HENT: No OP obstrucion Head: Normocephalic.  Cardiovascular: Normal rate and regular rhythm.  Respiratory: Effort normal and breath sounds normal.  GI: Soft. Bowel sounds are normal. No distension. There is no tenderness.  Skin: WDI   Neurologic Examination: Mental Status: Alert, oriented, thought content appropriate.  Speech fluent without evidence of aphasia.  Able to follow 3 step commands without difficulty. Cranial Nerves: II: optic discs not visualized, left eye impaired nasal inferior VF, otherwise fully intact, pupils equal, round, reactive to light and accommodation III,IV, VI: ptosis not present, extra-ocular motions intact bilaterally V,VII: smile symmetric, facial light touch sensation normal bilaterally VIII: hearing normal bilaterally IX,X: gag reflex present XI: trapezius strength/neck flexion strength normal bilaterally XII: tongue strength normal  Motor: Right : Upper extremity    Left:     Upper extremity 5/5 deltoid       5/5 deltoid 5/5 biceps      5/5 biceps  5/5 triceps  5/5 triceps 5/5 hand grip      5/5 hand grip  Lower extremity     Lower extremity 5/5 hip flexor      5/5 hip flexor 5/5 quadricep      5/5 quadriceps  5/5 hamstrings     5/5 hamstrings 5/5 plantar flexion       5/5 plantar flexion 5/5 plantar extension     5/5 plantar extension Tone and bulk:normal tone throughout; no atrophy noted Sensory: Pinprick and light touch intact throughout, bilaterally Deep Tendon Reflexes: 2+ and symmetric throughout Plantars: Right:  downgoing   Left: downgoing Cerebellar: normal finger-to-nose, and normal heel-to-shin test Gait: normal gait and station  Laboratory Studies:   Basic Metabolic Panel:  Recent Labs Lab 05/24/15 1452 05/24/15 1543  NA 140 142  K 4.0 3.9  CL 105 104  CO2 23  --   GLUCOSE 127* 86  BUN 8 10  CREATININE 1.00 0.90  CALCIUM 9.5  --     Liver Function Tests:  Recent Labs Lab 05/24/15 1533  AST 23  ALT 41  ALKPHOS 80  BILITOT 0.5  PROT 7.0  ALBUMIN 3.9   No results for input(s): LIPASE, AMYLASE in the last 168 hours. No results for input(s): AMMONIA in the last 168 hours.  CBC:  Recent Labs Lab 05/24/15 1452 05/24/15 1543  WBC 5.4  --   HGB 15.5 15.6  HCT 46.1 46.0  MCV 91.3  --   PLT 230  --     Cardiac Enzymes: No results for input(s): CKTOTAL, CKMB, CKMBINDEX, TROPONINI in the last 168 hours.  BNP: Invalid input(s): POCBNP  CBG: No results for input(s): GLUCAP in the last 168 hours.  Microbiology: Results for orders placed or performed during the hospital encounter of 06/01/13  CSF culture     Status: None   Collection Time: 06/01/13  1:49 PM  Result Value Ref Range Status   Specimen Description CSF  Final   Special Requests Normal  Final   Gram Stain   Final    NO WBC SEEN NO ORGANISMS SEEN Performed at [facility]   Culture   Final    NO GROWTH 3 DAYS Performed at [facility]   Report Status 06/05/2013 FINAL  Final  Gram stain - STAT with CSF culture     Status: None   Collection Time: 06/01/13  1:49 PM  Result Value Ref Range Status   Specimen Description CSF  Final   Special Requests Normal  Final   Gram Stain   Final    CYTO NO WBC SEEN NO ORGANISMS SEEN Gram Stain Report Called to,Read Back By and Verified With: SLACK S,RN 1455 06/01/13 SCALES H   Report Status 06/01/2013 FINAL  Final    Coagulation Studies:  Recent Labs  05/24/15 1452  LABPROT 13.9  INR 1.05    Urinalysis:  Recent Labs Lab  05/24/15 1540  COLORURINE YELLOW  LABSPEC 1.008  PHURINE 7.0  GLUCOSEU NEGATIVE  HGBUR MODERATE*  BILIRUBINUR NEGATIVE  KETONESUR NEGATIVE  PROTEINUR NEGATIVE  NITRITE NEGATIVE  LEUKOCYTESUR SMALL*    Lipid Panel:  No results found for: CHOL, TRIG, HDL, CHOLHDL, VLDL, LDLCALC  HgbA1C: No results found for: HGBA1C  Urine Drug Screen:     Component Value Date/Time   LABOPIA NONE DETECTED 05/24/2015 1540   COCAINSCRNUR NONE DETECTED 05/24/2015 1540   LABBENZ NONE DETECTED 05/24/2015 1540   AMPHETMU NONE DETECTED 05/24/2015 [place] DETECTED 05/24/2015 1540  LABBARB NONE DETECTED 05/24/2015 1540    Alcohol Level:  Recent Labs Lab 05/24/15 [place] <5    Other results: EKG: normal EKG, normal sinus rhythm.  Imaging: Dg Chest 2 View  05/24/2015  CLINICAL DATA:  50 year old male with decreased vision in the left eye for the past 2 days. Possible branch retinal artery occlusion. EXAM: CHEST  2 VIEW COMPARISON:  Chest x-ray 01/25/2015. FINDINGS: Lung volumes are normal. No consolidative airspace disease. No pleural effusions. No pneumothorax. No pulmonary nodule or mass noted. Pulmonary vasculature and the cardiomediastinal silhouette are within normal limits. IMPRESSION: No radiographic evidence of acute cardiopulmonary disease. Electronically Signed   By: Vinnie Langton M.D.   On: 05/24/2015 18:26   Mr Brain Wo Contrast  05/24/2015  CLINICAL DATA:  Painless visual loss LEFT eye 2 days ago. Chronic headaches. EXAM: MRI HEAD WITHOUT CONTRAST TECHNIQUE: Multiplanar, multiecho pulse sequences of the brain and surrounding structures were obtained without intravenous contrast. COMPARISON:  CT head 05/25/2013. FINDINGS: No evidence for acute infarction, hemorrhage, mass lesion, hydrocephalus, or extra-axial fluid. Normal for age cerebral volume. Mild subcortical white matter signal abnormality, nonspecific. No periventricular or posterior fossa lesions. Flow voids are  maintained throughout the carotid, basilar, and vertebral arteries. There are no areas of chronic hemorrhage. The pituitary is flattened and the sella is mildly enlarged. Findings consistent with empty sella. A dedicated orbital study was not performed. Within limits for evaluation on routine MR head, no orbital findings. No sinus or mastoid disease.  Scalp soft tissues unremarkable. Compared with prior CT, the abnormalities described today would be difficult to visualize. IMPRESSION: No acute intracranial findings. No restricted diffusion to suggest acute infarction. Within limits for assessment on routine brain MR, no orbital mass or inflammation. Mild subcortical white matter signal abnormality, nonspecific. This could represent sequelae of complicated migraine, early chronic microvascular ischemic change, vasculitis, or chronic infection. Correlate clinically. Empty sella. The pituitary is flattened and the sella is mildly enlarged. This can result in a variety of nonspecific complaints, including visual difficulty. Correlate clinically. Electronically Signed   By: Staci Righter M.D.   On: 05/24/2015 18:07    Assessment: 50 y.o. male remote hx of A fib (not on anticoagulation), HTN presenting with acute vision field loss in his left eye. Saw eye doctor and diagnosed with a retinal artery occlusion. Exam shows nasal inferior VF cut, otherwise normal exam. Patient notes chronic history of daily headache.    Plan: 1. HgbA1c, fasting lipid panel 2.MRA  of the brain without contrast 3. PT consult, OT consult, Speech consult 4. Echocardiogram 5. Carotid dopplers 6. Prophylactic therapy-ASA '325mg'$ . Will need to consider long-term anticoagulation if A fib found 7. Risk factor modification 8. Telemetry monitoring - would consider long term cardiac monitoring 9. Frequent neuro checks 10. NPO until RN stroke swallow screen 11. Check ESR and CRP    Jim Like,  DO [facility] [phone]  If 7pm- 7am, please page neurology on call as listed in AMION. 05/24/2015, 7:24 PM

## 2015-05-24 NOTE — H&P (Signed)
[facility] History and Physical  Nathaniel Pope IHK:[id number] DOB: 1966/05/13 DOA: 05/24/2015  Referring physician: Regenia Skeeter PCP: Robert Bellow, MD   Chief Complaint: blurred vision left eye  HPI: Nathaniel Pope is a 50 y.o. male past medical history that includes hypertension, hyperlipidemia, A. fib all resolved according to the patient presents to the ED from ophthalmology chief complaint branch retinal artery occlusion acquiring a stroke workup.  Patient reports 2 days ago developed sudden acute painless vision loss inferior field of vision of the left eye. He reports chronic "migraine" headaches and takes Gabriel Earing powders almost daily. He denies dizziness syncope or near-syncope. He denies a numbness or tingling of extremities. He denies any difficulty swallowing chewing speaking. He denies any chest pain palpitations shortness of breath. He denies any fever chills recent travel or sick contacts. He does report about 10 years ago was diagnosed with atrial fibrillation is on medications for 1 year and these were discontinued per patient. Reports continued daily palpitations.  In the emergency department he is afebrile hemodynamically stable and not hypoxic. In addition he passed a bedside swallow eval   Review of Systems:  10 point review of systems completed all systems are negative except as indicated in the history of present illness Past Medical History  Diagnosis Date  . Migraines   . Atrial fibrillation ([place])   . Bicuspid aortic valve   . Heart murmur   . Kidney stones   . Retinal artery occlusion, branch     05/2015  . HTN (hypertension)    Past Surgical History  Procedure Laterality Date  . Hernia repair    . Ruptured kidney wall    . Tonsillectomy     Social History:  reports that he quit smoking about 16 years ago. His smoking use included Cigarettes. He has a 22.5 pack-year smoking history. He has never used smokeless tobacco. He reports that he drinks  alcohol. He reports that he does not use illicit drugs. He works full-time at a [employer] officer. He is independent with ADLs No Known Allergies  Family History  Problem Relation Age of Onset  . Diabetes Mother   . Cancer Father    Sr. with diabetes  Prior to Admission medications   Medication Sig Start Date End Date Taking? Authorizing Provider  Aspirin-Acetaminophen-Caffeine (GOODYS EXTRA STRENGTH) 260-130-16 MG TABS Take 1 packet by mouth every 6 (six) hours as needed (pain).   Yes Historical Provider, MD   Physical Exam: Filed Vitals:   05/24/15 1501 05/24/15 1502 05/24/15 1526 05/24/15 1634  BP: 131/98   121/95  Pulse:  80  72  Temp:   98.1 F (36.7 C)   TempSrc:   Oral   Resp:  16  12  SpO2:  98%  99%    Wt Readings from Last 3 Encounters:  10/26/13 99.791 kg (220 lb)  10/04/13 95.255 kg (210 lb)  08/06/13 97.614 kg (215 lb 3.2 oz)    General:  Appears calm and comfortable well-nourished face flushed Eyes: PERRL, normal lids, irises & conjunctiva ENT: grossly normal hearing, lips & tongue Neck: no LAD, masses or thyromegaly Cardiovascular: RRR, +murmur. No LE edema.  Respiratory: CTA bilaterally, no w/r/r. Normal respiratory effort. Abdomen: soft, ntnd positive bowel sounds are out nontender to palpation Skin: no rash or induration seen on limited exam Musculoskeletal: grossly normal tone BUE/BLE, joints without swelling/erythema Psychiatric: grossly normal mood and affect, speech fluent and appropriate Neurologic: grossly non-focal. Speech clear facial symmetry bilateral grip 5 out  of 5 lower extremity strength 5 out of 5 bilaterally no pronator drift           Labs on Admission:  Basic Metabolic Panel:  Recent Labs Lab 05/24/15 1452 05/24/15 1543  NA 140 142  K 4.0 3.9  CL 105 104  CO2 23  --   GLUCOSE 127* 86  BUN 8 10  CREATININE 1.00 0.90  CALCIUM 9.5  --    Liver Function Tests:  Recent Labs Lab 05/24/15 1533  AST 23  ALT 41  ALKPHOS  80  BILITOT 0.5  PROT 7.0  ALBUMIN 3.9   No results for input(s): LIPASE, AMYLASE in the last 168 hours. No results for input(s): AMMONIA in the last 168 hours. CBC:  Recent Labs Lab 05/24/15 1452 05/24/15 1543  WBC 5.4  --   HGB 15.5 15.6  HCT 46.1 46.0  MCV 91.3  --   PLT 230  --    Cardiac Enzymes: No results for input(s): CKTOTAL, CKMB, CKMBINDEX, TROPONINI in the last 168 hours.  BNP (last 3 results) No results for input(s): BNP in the last 8760 hours.  ProBNP (last 3 results) No results for input(s): PROBNP in the last 8760 hours.  CBG: No results for input(s): GLUCAP in the last 168 hours.  Radiological Exams on Admission: No results found.  EKG: Independently reviewed. 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 per ophthalmology evaluation. Concern for stroke. Symptoms persist. -Admit to telemetry and follow stroke protocol. -MRI, MRA -Carotid Dopplers and 2-D echo -Lipid panel hemoglobin A1c -Aspirin and statin -Control blood pressure - Neurology consult - will also check crp and esr  #2 hypertension. Patient reports being diagnosed in the past but is not on medication. Blood pressure with only fair control in the emergency department -Hold off on antihypertensives for now given #1 -We'll check blood pressure frequently -At discharge may need to be started on intro anti-hypertensive med  3. Chronic headaches. -MRI pending -See #1   Neurology per dr Regenia Skeeter  Code Status: full DVT Prophylaxis: Family Communication: son at bedside Disposition Plan: home when ready  Time spent: 14 minutes  [facility]

## 2015-05-24 NOTE — ED Notes (Signed)
Pt and family made aware of bed assignment 

## 2015-05-24 NOTE — ED Provider Notes (Signed)
CSN: [id number]     Arrival date & time 05/24/15  1417 History   First MD Initiated Contact with Patient 05/24/15 1504     Chief Complaint  Patient presents with  . Visual Field Change     (Consider location/radiation/quality/duration/timing/severity/associated sxs/prior Treatment) HPI  50 year old male presents from his ophthalmology office for a stroke workup. Patient states that 2 days ago while watching TV he noticed a sudden change in his left-sided vision. There is about 25% of his middle visual field that he cannot see anything out of. This has remained constant since started 2 days ago. Patient states that he has no new pain or ocular pain. He went to his ophthalmologist was diagnosed with a retinal artery occlusion. He was sent here for a stroke workup. Patient has chronic daily headaches that he states are not worse than typical. He denies any new weakness or numbness. The patient states that he was diagnosed with atrial fibrillation about 10 years ago. He has not been particular symptom manic since that initial diagnosis in the ER. However he does tell me that he has palpitations nearly every day. He is not on any blood thinners or specific treatment for A. fib. He does not have a primary care physician. Patient states he has a chronic heart murmur.  Past Medical History  Diagnosis Date  . Migraines   . Atrial fibrillation ([facility])   . Bicuspid aortic valve   . Heart murmur   . Kidney stones    Past Surgical History  Procedure Laterality Date  . Hernia repair    . Ruptured kidney wall    . Tonsillectomy     Family History  Problem Relation Age of Onset  . Diabetes Mother   . Cancer Father    Social History  Substance Use Topics  . Smoking status: Former Smoker -- 1.50 packs/day for 15 years    Types: Cigarettes    Quit date: 05/29/1998  . Smokeless tobacco: Never Used  . Alcohol Use: Yes     Comment: rarely    Review of Systems  Eyes: Positive for visual disturbance.  Negative for pain.  Cardiovascular: Positive for palpitations.  Neurological: Positive for headaches (chronic, daily headaches). Negative for weakness and numbness.  All other systems reviewed and are negative.     Allergies  Review of patient's allergies indicates no known allergies.  Home Medications   Prior to Admission medications   Medication Sig Start Date End Date Taking? Authorizing Provider  oxyCODONE-acetaminophen (PERCOCET) 5-325 MG per tablet Take 2 tablets by mouth every 4 (four) hours as needed. 10/26/13   Geoffery Lyonsouglas Delo, MD   BP 131/98 mmHg  Pulse 80  Temp(Src) 98.1 F (36.7 C) (Oral)  Resp 16  SpO2 98% Physical Exam  Constitutional: He is oriented to person, place, and time. He appears well-developed and well-nourished.  HENT:  Head: Normocephalic and atraumatic.  Right Ear: External ear normal.  Left Ear: External ear normal.  Nose: Nose normal.  Eyes: EOM are normal. Pupils are equal, round, and reactive to light. Right eye exhibits no discharge. Left eye exhibits no discharge.  Neck: Neck supple.  Cardiovascular: Normal rate, regular rhythm and intact distal pulses.   Murmur heard. Pulmonary/Chest: Effort normal and breath sounds normal.  Abdominal: Soft. He exhibits no distension. There is no tenderness.  Musculoskeletal: He exhibits no edema.  Neurological: He is alert and oriented to person, place, and time.  CN 2-12 grossly intact. 5/5 strength in all 4 extremities.  Grossly normal sensation  Skin: Skin is warm and dry.  Nursing note and vitals reviewed.   ED Course  Procedures (including critical care time) Labs Review Labs Reviewed  BASIC METABOLIC PANEL - Abnormal; Notable for the following:    Glucose, Bld 127 (*)    All other components within normal limits  URINALYSIS, ROUTINE W REFLEX MICROSCOPIC (NOT AT [facility]) - Abnormal; Notable for the following:    Hgb urine dipstick MODERATE (*)    Leukocytes, UA SMALL (*)    All other components  within normal limits  C-REACTIVE PROTEIN - Abnormal; Notable for the following:    CRP 1.1 (*)    All other components within normal limits  URINE MICROSCOPIC-ADD ON - Abnormal; Notable for the following:    Squamous Epithelial / LPF 0-5 (*)    Bacteria, UA RARE (*)    All other components within normal limits  LIPID PANEL - Abnormal; Notable for the following:    Cholesterol 279 (*)    Triglycerides 313 (*)    VLDL 63 (*)    LDL Cholesterol 174 (*)    All other components within normal limits  CBC  PROTIME-INR  ETHANOL  URINE RAPID DRUG SCREEN, HOSP PERFORMED  HEPATIC FUNCTION PANEL  APTT  SEDIMENTATION RATE  HEMOGLOBIN A1C  I-STAT TROPOININ, ED  I-STAT CHEM 8, ED    Imaging Review Dg Chest 2 View  05/24/2015  CLINICAL DATA:  50 year old male with decreased vision in the left eye for the past 2 days. Possible branch retinal artery occlusion. EXAM: CHEST  2 VIEW COMPARISON:  Chest x-ray 01/25/2015. FINDINGS: Lung volumes are normal. No consolidative airspace disease. No pleural effusions. No pneumothorax. No pulmonary nodule or mass noted. Pulmonary vasculature and the cardiomediastinal silhouette are within normal limits. IMPRESSION: No radiographic evidence of acute cardiopulmonary disease. Electronically Signed   By: Trudie Reed M.D.   On: 05/24/2015 18:26   Mr Brain Wo Contrast  05/24/2015  CLINICAL DATA:  Painless visual loss LEFT eye 2 days ago. Chronic headaches. EXAM: MRI HEAD WITHOUT CONTRAST TECHNIQUE: Multiplanar, multiecho pulse sequences of the brain and surrounding structures were obtained without intravenous contrast. COMPARISON:  CT head 05/25/2013. FINDINGS: No evidence for acute infarction, hemorrhage, mass lesion, hydrocephalus, or extra-axial fluid. Normal for age cerebral volume. Mild subcortical white matter signal abnormality, nonspecific. No periventricular or posterior fossa lesions. Flow voids are maintained throughout the carotid, basilar, and vertebral  arteries. There are no areas of chronic hemorrhage. The pituitary is flattened and the sella is mildly enlarged. Findings consistent with empty sella. A dedicated orbital study was not performed. Within limits for evaluation on routine MR head, no orbital findings. No sinus or mastoid disease.  Scalp soft tissues unremarkable. Compared with prior CT, the abnormalities described today would be difficult to visualize. IMPRESSION: No acute intracranial findings. No restricted diffusion to suggest acute infarction. Within limits for assessment on routine brain MR, no orbital mass or inflammation. Mild subcortical white matter signal abnormality, nonspecific. This could represent sequelae of complicated migraine, early chronic microvascular ischemic change, vasculitis, or chronic infection. Correlate clinically. Empty sella. The pituitary is flattened and the sella is mildly enlarged. This can result in a variety of nonspecific complaints, including visual difficulty. Correlate clinically. Electronically Signed   By: Elsie Stain M.D.   On: 05/24/2015 18:07   Mr Maxine Glenn Head/brain Wo Cm  05/24/2015  CLINICAL DATA:  Acute onset LEFT vision changes. History of migraine, atrial fibrillation, hypertension. EXAM: MRA HEAD WITHOUT  CONTRAST TECHNIQUE: Angiographic images of the Circle of Willis were obtained using MRA technique without intravenous contrast. COMPARISON:  MRI of the brain May 24, 2015 at 1704 hours FINDINGS: Anterior circulation: Normal flow related enhancement of the included cervical, petrous, cavernous and supraclinoid internal carotid arteries. Patent anterior communicating artery. Flow related enhancement of the anterior and middle cerebral arteries, including distal segments. Mild stenosis versus flow artifact RIGHT proximal M2 segment. No large vessel occlusion, high-grade stenosis, abnormal luminal irregularity, aneurysm. Posterior circulation: RIGHT vertebral artery is dominant. Basilar artery is  patent, with normal flow related enhancement of the main branch vessels. Normal flow related enhancement of the posterior cerebral arteries. Fetal origin RIGHT posterior cerebral artery. Robust LEFT posterior cerebral artery. No large vessel occlusion, high-grade stenosis, abnormal luminal irregularity, aneurysm. IMPRESSION: No acute large vessel occlusion or high-grade stenosis. Mild stenosis versus artifact proximal RIGHT M2 segment. Electronically Signed   By: Awilda Metro M.D.   On: 05/24/2015 22:33   I have personally reviewed and evaluated these images and lab results as part of my medical decision-making.   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 Sent over for a stroke workup by his ophthalmologist. Otherwise his exam is unremarkable. Given his past history of atrial fibrillation and a bicuspid aortic valve with no reliable follow-up at this time, he will be admitted to the hospitalist for CVA workup and monitoring.    Pricilla Loveless, MD 05/25/15 0010

## 2015-05-24 NOTE — ED Notes (Signed)
Pt sent here from eye dr. Nicki Reapereports decreased vision to left eye since Saturday. No other neuro deficits noted at triage. Sent here due to branch retinal artery occlusion.

## 2015-05-24 NOTE — Progress Notes (Addendum)
Pt admitted to 5M18 from ER.  Alert and oriented.  Denies pain or discomfort.  Telemetry placed on patient and CCMD called.  Verified with Denny Peonamara Pollard, NT.  Call bell within reach and patient is independent.  Left pupil a 4 and right a 3.; both reactive and brisk. Pt states that he has visual loss of "25%" in the left eye.  NIH 0.  Will continue to monitor and assess.

## 2015-05-25 ENCOUNTER — Ambulatory Visit (HOSPITAL_BASED_OUTPATIENT_CLINIC_OR_DEPARTMENT_OTHER): Payer: BLUE CROSS/BLUE SHIELD

## 2015-05-25 DIAGNOSIS — I6789 Other cerebrovascular disease: Secondary | ICD-10-CM | POA: Diagnosis not present

## 2015-05-25 DIAGNOSIS — H34232 Retinal artery branch occlusion, left eye: Secondary | ICD-10-CM

## 2015-05-25 DIAGNOSIS — I6330 Cerebral infarction due to thrombosis of unspecified cerebral artery: Secondary | ICD-10-CM | POA: Insufficient documentation

## 2015-05-25 DIAGNOSIS — I10 Essential (primary) hypertension: Secondary | ICD-10-CM | POA: Diagnosis not present

## 2015-05-25 MED ORDER — TOPIRAMATE 25 MG PO TABS: 25.0000 mg | ORAL_TABLET | Freq: Two times a day (BID) | ORAL | Status: DC

## 2015-05-25 MED ORDER — ATORVASTATIN CALCIUM 80 MG PO TABS: 80.0000 mg | ORAL_TABLET | Freq: Every day | ORAL | Status: DC

## 2015-05-25 MED ORDER — APIXABAN 5 MG PO TABS
5.0000 mg | ORAL_TABLET | Freq: Two times a day (BID) | ORAL | Status: DC
  Administered 2015-05-25: 5 mg via ORAL
  Filled 2015-05-25: qty 1

## 2015-05-25 MED ORDER — ATORVASTATIN CALCIUM 80 MG PO TABS
80.0000 mg | ORAL_TABLET | Freq: Every day | ORAL | Status: DC
  Administered 2015-05-25: 80 mg via ORAL
  Filled 2015-05-25: qty 1

## 2015-05-25 MED ORDER — TOPIRAMATE 25 MG PO TABS
25.0000 mg | ORAL_TABLET | Freq: Two times a day (BID) | ORAL | Status: DC
  Administered 2015-05-25: 25 mg via ORAL
  Filled 2015-05-25: qty 1

## 2015-05-25 MED ORDER — APIXABAN 5 MG PO TABS: 5.0000 mg | ORAL_TABLET | Freq: Two times a day (BID) | ORAL | Status: DC

## 2015-05-25 NOTE — Progress Notes (Signed)
PT Cancellation Note  Patient Details Name: Jerrol Bananadward S Kaigler MRN: [id number] DOB: 08/26/1965   Cancelled Treatment:    Reason Eval/Treat Not Completed: Patient at procedure or test/unavailable. Will check back as schedule allows to complete PT eval.    Conni SlipperKirkman, Mann Skaggs 05/25/2015, 12:17 PM  Conni SlipperLaura Aidynn Polendo, PT, DPT Acute Rehabilitation Services Pager: [phone]

## 2015-05-25 NOTE — Discharge Summary (Signed)
Nathaniel Pope, is a 50 y.o. male  DOB [birth year]  MRN [id number].  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 Migraines   . Atrial fibrillation (HCC)   . Bicuspid aortic valve   . Heart murmur   . Kidney stones   . Retinal artery occlusion, branch     05/2015  . HTN (hypertension)     Past Surgical History  Procedure Laterality Date  . Hernia repair    . Ruptured kidney wall    . Tonsillectomy         HPI  from the history and physical done on the day of admission:   Nathaniel Pope is a 50 y.o. male past medical history that includes hypertension, hyperlipidemia, A. fib all resolved according to the patient presents to the ED from ophthalmology chief complaint branch retinal artery occlusion acquiring a stroke workup.  Patient reports 2 days ago developed sudden acute painless vision loss inferior field of vision of the left eye. He reports chronic "migraine" headaches and takes Marlin Canary powders almost daily. He denies dizziness syncope or near-syncope. He denies a numbness or tingling of extremities. He denies any difficulty swallowing chewing speaking. He denies any chest pain palpitations  shortness of breath. He denies any fever chills recent travel or sick contacts. He does report about 10 years ago was diagnosed with atrial fibrillation is on medications for 1 year and these were discontinued per patient. Reports continued daily palpitations.  In the emergency department he is afebrile hemodynamically stable and not hypoxic. In addition he passed a bedside swallow eval.     Hospital Course:     1.Left inferior nasal field defect. Per neurology due to occlusion of one of the retinal artery branches. Does have remote history of atrial fibrillation and was on Coumadin in the past. Also LDL was greater than 80. A1c is pending.  His been seen by neurology team and cleared for home discharge, he will be placed on Eliquis, statin. His A1c is pending request PCP to monitor. He was recommended to follow with a Cardiologist however declined to do so. Carotid duplex was unremarkable. Echo stable.   2. Dyslipidemia. Placed on statin.   3. E cigarette smoking intermittent alcohol use. Counseled to quit.   4. Paroxysmal atrial fibrillation. Chads VASC 2 score now greater than 3. Placed on Eliquis. Declined monitor or outpatient cardiology  follow-up.    5. Chronic migraine headaches. Placed on Topamax by neurology.    Discharge Condition: Stable  Follow UP  Follow-up Information    Follow up with SETHI,PRAMOD, MD In 2 months.   Specialties:  Neurology, Radiology   Why:  Stroke Clinic, Office will call you with appointment date & time   Contact information:   [address] [place] [address] [phone]       Follow up with Milana ObeyKNOWLTON,STEPHEN D, MD. Schedule an appointment as soon as possible for a visit in 1 week.   Specialty:  Family Medicine   Contact information:   [address] [place] [address] [phone]        Consults obtained - Neuro  Diet and Activity recommendation: See Discharge Instructions below  Discharge Instructions        Discharge Instructions    Ambulatory referral to Neurology    Complete by:  As directed   Please schedule post stroke follow up in 2 months.     Diet - low sodium heart healthy    Complete by:  As directed      Discharge instructions    Complete by:  As directed   Follow with Primary MD Milana ObeyKNOWLTON,STEPHEN D, MD in 7 days   Get CBC, CMP, 2 view Chest X ray checked  by Primary MD next visit.    Activity: As tolerated with Full fall precautions use walker/cane & assistance as needed   Disposition Home     Diet:   Heart Healthy    For Heart failure patients - Check your Weight same time everyday, if you gain over 2 pounds, or you develop in leg swelling, experience more shortness of breath or chest pain, call your Primary MD immediately. Follow Cardiac Low Salt Diet and 1.5 lit/day fluid restriction.   On your next visit with your primary care physician please Get Medicines reviewed and adjusted.   Please request your Prim.MD to go over all Hospital Tests and Procedure/Radiological results at the follow up, please get all Hospital records sent to your Prim MD by signing hospital release before you go home.   If you experience worsening of your admission symptoms, develop shortness of breath, life threatening emergency, suicidal or homicidal thoughts you must seek medical attention immediately by calling 911 or calling your MD immediately  if symptoms less severe.  You Must read complete instructions/literature along with all the possible adverse reactions/side effects for all the Medicines you take and that have been prescribed to you. Take any new Medicines after you have completely understood and accpet all the possible adverse reactions/side effects.   Do not drive, operating heavy machinery, perform activities at heights, swimming or participation in water activities or provide baby sitting services if your were admitted for syncope or siezures until you have seen by Primary MD  or a Neurologist and advised to do so again.  Do not drive when taking Pain medications.    Do not take more than prescribed Pain, Sleep and Anxiety Medications  Special Instructions: If you have smoked or chewed Tobacco  in the last 2 yrs please stop smoking, stop any regular Alcohol  and or any Recreational drug use.  Wear Seat belts while driving.   Please note  You were cared for by a hospitalist during your hospital stay. If you have any questions about your discharge medications or the care you received while you were in the hospital after you are discharged, you  can call the unit and asked to speak with the hospitalist on call if the hospitalist that took care of you is not available. Once you are discharged, your primary care physician will handle any further medical issues. Please note that NO REFILLS for any discharge medications will be authorized once you are discharged, as it is imperative that you return to your primary care physician (or establish a relationship with a primary care physician if you do not have one) for your aftercare needs so that they can reassess your need for medications and monitor your lab values.     Increase activity slowly    Complete by:  As directed              Discharge Medications       Medication List    STOP taking these medications        GOODYS EXTRA STRENGTH 260-130-16 MG Tabs  Generic drug:  Aspirin-Acetaminophen-Caffeine      TAKE these medications        apixaban 5 MG Tabs tablet  Commonly known as:  ELIQUIS  Take 1 tablet (5 mg total) by mouth 2 (two) times daily.     atorvastatin 80 MG tablet  Commonly known as:  LIPITOR  Take 1 tablet (80 mg total) by mouth daily at 6 PM.     topiramate 25 MG tablet  Commonly known as:  TOPAMAX  Take 1 tablet (25 mg total) by mouth 2 (two) times daily.        Major procedures and Radiology Reports - PLEASE review detailed and final reports for all details, in brief -    TTE  - Normal LV size with mild LV hypertrophy. EF 55%. Normal RV sizeand systolic function. Bicuspid aortic valve with mild aortic insufficiency and moderate aortic stenosis.  Carotid duplex has been completed. Bilateral: 1-39% ICA stenosis. Vertebral artery flow is antegrade.   Dg Chest 2 View  05/24/2015  CLINICAL DATA:  50 year old male with decreased vision in the left eye for the past 2 days. Possible branch retinal artery occlusion. EXAM: CHEST  2 VIEW COMPARISON:  Chest x-ray 01/25/2015. FINDINGS: Lung volumes are normal. No consolidative airspace disease. No pleural effusions. No pneumothorax. No pulmonary nodule or mass noted. Pulmonary vasculature and the cardiomediastinal silhouette are within normal limits. IMPRESSION: No radiographic evidence of acute cardiopulmonary disease. Electronically Signed   By: Trudie Reed M.D.   On: 05/24/2015 18:26   Mr Brain Wo Contrast  05/24/2015  CLINICAL DATA:  Painless visual loss LEFT eye 2 days ago. Chronic headaches. EXAM: MRI HEAD WITHOUT CONTRAST TECHNIQUE: Multiplanar, multiecho pulse sequences of the brain and surrounding structures were obtained without intravenous contrast. COMPARISON:  CT head 05/25/2013. FINDINGS: No evidence for acute infarction, hemorrhage, mass lesion, hydrocephalus, or extra-axial fluid. Normal for age cerebral volume. Mild subcortical white matter signal abnormality, nonspecific. No periventricular or posterior fossa lesions. Flow voids are maintained throughout the carotid, basilar, and vertebral arteries. There are no areas of chronic hemorrhage. The pituitary is flattened and the sella is mildly enlarged. Findings consistent with empty sella. A dedicated orbital study was not performed. Within limits for evaluation on routine MR head, no orbital findings. No sinus or mastoid disease.  Scalp soft tissues unremarkable. Compared with prior CT, the abnormalities described today would be difficult to visualize.  IMPRESSION: No acute intracranial findings. No restricted diffusion to suggest acute infarction. Within limits for assessment on routine brain MR, no orbital mass or inflammation. Mild subcortical  white matter signal abnormality, nonspecific. This could represent sequelae of complicated migraine, early chronic microvascular ischemic change, vasculitis, or chronic infection. Correlate clinically. Empty sella. The pituitary is flattened and the sella is mildly enlarged. This can result in a variety of nonspecific complaints, including visual difficulty. Correlate clinically. Electronically Signed   By: Elsie Stain M.D.   On: 05/24/2015 18:07   Mr Maxine Glenn Head/brain Wo Cm  05/24/2015  CLINICAL DATA:  Acute onset LEFT vision changes. History of migraine, atrial fibrillation, hypertension. EXAM: MRA HEAD WITHOUT CONTRAST TECHNIQUE: Angiographic images of the Circle of Willis were obtained using MRA technique without intravenous contrast. COMPARISON:  MRI of the brain May 24, 2015 at 1704 hours FINDINGS: Anterior circulation: Normal flow related enhancement of the included cervical, petrous, cavernous and supraclinoid internal carotid arteries. Patent anterior communicating artery. Flow related enhancement of the anterior and middle cerebral arteries, including distal segments. Mild stenosis versus flow artifact RIGHT proximal M2 segment. No large vessel occlusion, high-grade stenosis, abnormal luminal irregularity, aneurysm. Posterior circulation: RIGHT vertebral artery is dominant. Basilar artery is patent, with normal flow related enhancement of the main branch vessels. Normal flow related enhancement of the posterior cerebral arteries. Fetal origin RIGHT posterior cerebral artery. Robust LEFT posterior cerebral artery. No large vessel occlusion, high-grade stenosis, abnormal luminal irregularity, aneurysm. IMPRESSION: No acute large vessel occlusion or high-grade stenosis. Mild stenosis versus artifact proximal  RIGHT M2 segment. Electronically Signed   By: Awilda Metro M.D.   On: 05/24/2015 22:33    Micro Results      No results found for this or any previous visit (from the past 240 hour(s)).     Today   Subjective    Nathaniel Pope today has no headache,no chest abdominal pain,no new weakness tingling or numbness, feels much better wants to go home today.     Objective   Blood pressure 113/76, pulse 66, temperature 97.9 F (36.6 C), temperature source Oral, resp. rate 18, height 6\' 2"  (1.88 m), weight 95.664 kg (210 lb 14.4 oz), SpO2 97 %.  No intake or output data in the 24 hours ending 05/25/15 1356  Exam Awake Alert, Oriented x 3, No new F.N deficits, Normal affect, continues to have left nasal lower visual field defect [place].AT,PERRAL Supple Neck,No JVD, No cervical lymphadenopathy appriciated.  Symmetrical Chest wall movement, Good air movement bilaterally, CTAB RRR,No Gallops,Rubs or new Murmurs, No Parasternal Heave +ve B.Sounds, Abd Soft, Non tender, No organomegaly appriciated, No rebound -guarding or rigidity. No Cyanosis, Clubbing or edema, No new Rash or bruise   Data Review   CBC w Diff:  Lab Results  Component Value Date   WBC 5.4 05/24/2015   WBC 4.5* 06/01/2013   HGB 15.6 05/24/2015   HGB 15.1 06/01/2013   HCT 46.0 05/24/2015   HCT 48.4 06/01/2013   PLT 230 05/24/2015   LYMPHOPCT 35 10/26/2013   MONOPCT 6 10/26/2013   EOSPCT 3 10/26/2013   BASOPCT 1 10/26/2013    CMP:  Lab Results  Component Value Date   NA 142 05/24/2015   K 3.9 05/24/2015   CL 104 05/24/2015   CO2 23 05/24/2015   BUN 10 05/24/2015   CREATININE 0.90 05/24/2015   CREATININE 0.85 06/01/2013   PROT 7.0 05/24/2015   ALBUMIN 3.9 05/24/2015   BILITOT 0.5 05/24/2015   ALKPHOS 80 05/24/2015   AST 23 05/24/2015   ALT 41 05/24/2015  . Lab Results  Component Value Date   CHOL 279* 05/24/2015   HDL  42 05/24/2015   LDLCALC 174* 05/24/2015   TRIG 313* 05/24/2015   CHOLHDL  6.6 05/24/2015   No results found for: HGBA1C   Total Time in preparing paper work, data evaluation and todays exam - 35 minutes  Susa Raring K M.D on 05/25/2015 at 1:56 PM  [facility]   Office  [phone]

## 2015-05-25 NOTE — Progress Notes (Signed)
VASCULAR LAB PRELIMINARY  PRELIMINARY  PRELIMINARY  PRELIMINARY    Carotid duplex has been completed.   Bilateral:  1-39% ICA stenosis.  Vertebral artery flow is antegrade.    Nathaniel Pope, RVT, RDMS 05/25/2015, 11:52 AM

## 2015-05-25 NOTE — Evaluation (Signed)
Physical Therapy Evaluation and Discharge Patient Details Name: Nathaniel Pope MRN: [id number] DOB: 1966/02/10 Today's Date: 05/25/2015   History of Present Illness  Pt is a 50 y/o male who presents with visual changes. Pt reports 2 days ago sudden loss of inferior field vision of the L eye. Pt presented from the opthamologist with a branch retinal artery occlusion, requiring a stroke workup.   Clinical Impression  Patient evaluated by Physical Therapy with no further acute PT needs identified. All education has been completed and the patient has no further questions. At the time of PT eval pt was able to perform all mobility independently. Pt reports no dizziness with head turns during gait training. See below for any follow-up Physical Therapy or equipment needs. PT is signing off. Thank you for this referral.     Follow Up Recommendations No PT follow up    Equipment Recommendations  None recommended by PT    Recommendations for Other Services       Precautions / Restrictions Precautions Precautions: None Restrictions Weight Bearing Restrictions: No      Mobility  Bed Mobility Overal bed mobility: Independent                Transfers Overall transfer level: Independent Equipment used: None                Ambulation/Gait Ambulation/Gait assistance: Independent Ambulation Distance (Feet): 600 Feet Assistive device: None Gait Pattern/deviations: WFL(Within Functional Limits)   Gait velocity interpretation: at or above normal speed for age/gender    Stairs Stairs: Yes Stairs assistance: Independent Stair Management: No rails Number of Stairs: 10    Wheelchair Mobility    Modified Rankin (Stroke Patients Only)       Balance Overall balance assessment: No apparent balance deficits (not formally assessed)                                           Pertinent Vitals/Pain Pain Assessment: No/denies pain    Home Living  Family/patient expects to be discharged to:: Private residence Living Arrangements: Spouse/significant other Available Help at Discharge: Family Type of Home: House Home Access: Stairs to enter   Secretary/administratorntrance Stairs-Number of Steps: 6 Home Layout: One level Home Equipment: None      Prior Function Level of Independence: Independent               Hand Dominance   Dominant Hand: Right    Extremity/Trunk Assessment   Upper Extremity Assessment: Overall WFL for tasks assessed           Lower Extremity Assessment: Overall WFL for tasks assessed      Cervical / Trunk Assessment: Normal  Communication   Communication: No difficulties  Cognition Arousal/Alertness: Awake/alert Behavior During Therapy: WFL for tasks assessed/performed Overall Cognitive Status: Within Functional Limits for tasks assessed                      General Comments      Exercises        Assessment/Plan    PT Assessment Patent does not need any further PT services  PT Diagnosis Difficulty walking   PT Problem List    PT Treatment Interventions     PT Goals (Current goals can be found in the Care Plan section) Acute Rehab PT Goals PT Goal Formulation: All assessment and education complete,  DC therapy    Frequency     Barriers to discharge        Co-evaluation               End of Session   Activity Tolerance: Patient tolerated treatment well Patient left: in chair;with call bell/phone within reach Nurse Communication: Mobility status    Functional Assessment Tool Used: Clinical judgement Functional Limitation: Mobility: Walking and moving around Mobility: Walking and Moving Around Current Status (Z6109): At least 1 percent but less than 20 percent impaired, limited or restricted Mobility: Walking and Moving Around Goal Status [phone]): At least 1 percent but less than 20 percent impaired, limited or restricted Mobility: Walking and Moving Around Discharge Status  [phone]): At least 1 percent but less than 20 percent impaired, limited or restricted    Time: 1348-1403 PT Time Calculation (min) (ACUTE ONLY): 15 min   Charges:   PT Evaluation $PT Eval Low Complexity: 1 Procedure     PT G Codes:   PT G-Codes **NOT FOR INPATIENT CLASS** Functional Assessment Tool Used: Clinical judgement Functional Limitation: Mobility: Walking and moving around Mobility: Walking and Moving Around Current Status (B1478): At least 1 percent but less than 20 percent impaired, limited or restricted Mobility: Walking and Moving Around Goal Status [phone]): At least 1 percent but less than 20 percent impaired, limited or restricted Mobility: Walking and Moving Around Discharge Status [phone]): At least 1 percent but less than 20 percent impaired, limited or restricted    Conni Slipper 05/25/2015, 2:46 PM  Conni Slipper, PT, DPT Acute Rehabilitation Services Pager: [phone]

## 2015-05-25 NOTE — Progress Notes (Signed)
STROKE TEAM PROGRESS NOTE   HISTORY Nathaniel Pope is an 50 y.o. male hx of A fib, HTN referred from eye doctor for stroke workup. He reports that 2 days ago he noted a sudden deficit in his left eye. Notes that there is around 25% of the middle part of his visual field in the left eye that he cannot see out of. Denies any floaters, eye pain. Went to his ophthalmologist and was diagnosed with a retinal artery occlusion. He does note a history of daily headache. Denies any speech, motor or sensory deficits. Was diagnosed with A fib around 10 years ago, currently not on any anticoagulation. Reports initially being on blood thinner but stopped after he was told he did not need it as his a fib resolved. MRI head imaging reviewed, no acute infarct. He was last known well 05/22/15, time unclear. Modified Rankin: Rankin Score=0. Patient was not administered TPA secondary to being outside tPA window. He was admitted for further evaluation and treatment.   SUBJECTIVE (INTERVAL HISTORY) No family is at the bedside.  Overall he feels his condition is stable. 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 Due to the snow, he was not working this weekend and was home watching TV when he noted the vision abnormality.    OBJECTIVE Temp:  [97.7 F (36.5 C)-98.4 F (36.9 C)] 98 F (36.7 C) (01/10 1023) Pulse Rate:  [52-80] 55 (01/10 1023) Cardiac Rhythm:  [-] Sinus bradycardia (01/10 0700) Resp:  [12-18] 18 (01/10 1023) BP: (94-131)/(57-98) 111/79 mmHg (01/10 1023) SpO2:  [96 %-99 %] 98 % (01/10 1023) Weight:  [95.664 kg (210 lb 14.4 oz)] 95.664 kg (210 lb 14.4 oz) (01/09 1835)  CBC:   Recent Labs Lab 05/24/15 1452 05/24/15 1543  WBC 5.4  --   HGB 15.5 15.6  HCT 46.1 46.0  MCV 91.3  --   PLT 230  --     Basic Metabolic Panel:   Recent Labs Lab 05/24/15 1452 05/24/15 1543  NA 140 142  K 4.0 3.9  CL 105 104  CO2 23  --   GLUCOSE 127* 86  BUN 8  10  CREATININE 1.00 0.90  CALCIUM 9.5  --     Lipid Panel:     Component Value Date/Time   CHOL 279* 05/24/2015 1533   TRIG 313* 05/24/2015 1533   HDL 42 05/24/2015 1533   CHOLHDL 6.6 05/24/2015 1533   VLDL 63* 05/24/2015 1533   LDLCALC 174* 05/24/2015 1533   HgbA1c: No results found for: HGBA1C Urine Drug Screen:     Component Value Date/Time   LABOPIA NONE DETECTED 05/24/2015 1540   COCAINSCRNUR NONE DETECTED 05/24/2015 1540   LABBENZ NONE DETECTED 05/24/2015 1540   AMPHETMU NONE DETECTED 05/24/2015 1540   THCU NONE DETECTED 05/24/2015 1540   LABBARB NONE DETECTED 05/24/2015 1540      IMAGING  Dg Chest 2 View 05/24/2015  No radiographic evidence of acute cardiopulmonary disease.   Mr Brain Wo Contrast 05/24/2015  No acute intracranial findings. No restricted diffusion to suggest acute infarction. Within limits for assessment on routine brain MR, no orbital mass or inflammation. Mild subcortical white matter signal abnormality, nonspecific. This could represent sequelae of complicated migraine, early chronic microvascular ischemic change, vasculitis, or chronic infection. Correlate clinically. Empty sella. The pituitary is flattened and the sella is mildly enlarged. This can result in a variety of nonspecific complaints, including visual difficulty.  Correlate clinically.   Mr Maxine Glenn Head/brain Wo Cm 05/24/2015   No acute large vessel occlusion or high-grade stenosis. Mild stenosis versus artifact proximal RIGHT M2 segment.   Carotid Doppler   There is 1-39% bilateral ICA stenosis. Vertebral artery flow is antegrade.     PHYSICAL EXAM Pleasant middle-age Caucasian male not in distress. . Afebrile. Head is nontraumatic. Neck is supple without bruit.    Cardiac exam no murmur or gallop. Lungs are clear to auscultation. Distal pulses are well felt. Neurological Exam :  Awake alert oriented 3 with normal speech and language function. No aphasia, dysarthria or ataxia. Extraocular  movements are full range without nystagmus. Fundi were not visualized. Large scotoma in the left inferior nasal quadrant. Visual fields are otherwise normal to testing elsewhere. Face is symmetric without weakness. Tongue is midline. Motor system exam revealed no upper or lower extremity drift. Symmetric and equal strength in all 4 extremities. Sensation is intact. Coordination is normal. Reflexes are symmetric. Plantars are downgoing. Gait is normal. ASSESSMENT/PLAN Nathaniel Pope is a 50 y.o. male with history of AF and HTN presenting from the eye doctor with L eye visual field deficit for stroke workup. He did not receive IV t-PA due to delay in arrival.   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 Empty sella. Pituitary flattened  MRA  No large vessel stenosis  Carotid Doppler  No significant stenosis   2D Echo  Done. Results pending   LDL 174  HgbA1c pending  Lovenox 30 mg sq daily for VTE prophylaxis Diet Heart Room service appropriate?: Yes; Fluid consistency:: Thin  Patient takes 1-3 or 4 goody powders daily (each one has 520 mg aspirin along with tylenol and caffeine) prior to admission, now on No antithrombotics.  Dr. Pearlean Brownie had a long discussion with him about anticoagulant therapy. Dr. 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 Started on eliquis.. Stopped daily Goody powder use. Patient counseled to be compliant with his antithrombotic medications  Ongoing aggressive stroke risk factor management  Therapy recommendations:  pending   Disposition:  Anticipate return home  [facility] for discharge once echo resulted  Recommend follow up with PCP (he uses urgent care) and cardiology   Follow up Dr. Pearlean Brownie in 2 months. Stroke clinic. Order written.  Atrial Fibrillation  Home anticoagulation:  None  Had been on warfarin the past - had follow up for 2 years at  [facility]. He was told he could stop because "afib resolved per pt"  CHA2DS2-VASc Score = 3, ?2 oral anticoagulation recommended  Age in Years:  <65   0    Sex:  Male   0    Hypertension History:  yes   +1     Diabetes Mellitus:  0   Congestive Heart Failure History:  0  Vascular Disease History:  0     Stroke/TIA/Thromboembolism History:  yes   +2    Hypertension  Stable  Hyperlipidemia  Home meds:  No statin  LDL 174, goal < 70  Added lipitor 80 mg daily  Continue statin at discharge  Other Stroke Risk Factors  Former Cigarette smoker, quit smoking 16 years ago  ETOH use  Migraines  Daily HA per pt  Has never been on preventative therapy  States HAs are familial - though he also thinks it is stress related  Start topamax 25 mg bid. Can increased  to 50 mg bid after 1 week if no relief. Dr Pearlean Browniesethi discussed side effects in depth with pt.   Other Active Problems  Bicuspid AV  Kidney steons  Hospital day #   Rhoderick MoodyBIBY,SHARON  [facility] See Amion for Pager information 05/25/2015 12:52 PM  I have personally examined this patient, reviewed notes, independently viewed imaging studies, participated in medical decision making and plan of care. I have made any additions or clarifications directly to the above note. Agree with note above. Patient presented with the partial loss of vision in the left eye due to scotoma from left retinal artery branch occlusion etiology likely embolic given history of prior atrial fibrillation. He remains at risk for recurrent stroke, TIA, neurological worsening and will benefit from ongoing stroke evaluation. I recommend anticoagulation with eliquis given his high Italychad 2 vascular score. Add Lipitor 80 mg daily for elevated lipids. Patient was counseled to stop taking Goody powders every day as it will increase the risk for proceeding. He was started on Topamax 25 twice daily for his tension headaches and migraines. He was advised to  follow-up as an outpatient in the office in 1-2 months.  Delia HeadyPramod Tarri Guilfoil, MD Medical Director [facility] Pager: [phone] 05/25/2015 2:47 PM    To contact Stroke Continuity provider, please refer to [email or web address]. After hours, contact General Neurology

## 2015-05-25 NOTE — Progress Notes (Signed)
SLP Cancellation Note  Patient Details Name: Nathaniel Pope MRN: [id number] DOB: Aug 15, 1965   Cancelled treatment:       Reason Eval/Treat Not Completed: SLP screened, no needs identified, will sign off   Blenda MountsCouture, Daquon Greenleaf Laurice 05/25/2015, 1:19 PM

## 2015-05-25 NOTE — Progress Notes (Signed)
Pt discharged per orders from MD. Pt educated on discharge instructions. Pt verbalized understanding. Pt also consented to EMMI form. IV removed from pt prior to discharge. All questions and concerns were answered. Pt was able to ambulate and exit hospital on his own effort.

## 2015-05-25 NOTE — Progress Notes (Signed)
  Echocardiogram 2D Echocardiogram has been performed.  Tye SavoyCasey N Timarion Agcaoili 05/25/2015, 12:42 PM

## 2015-05-25 NOTE — Discharge Instructions (Signed)
Follow with Primary MD Milana Obey, MD in 7 days   Get CBC, CMP, 2 view Chest X ray checked  by Primary MD next visit.    Activity: As tolerated with Full fall precautions use walker/cane & assistance as needed   Disposition Home     Diet:   Heart Healthy    For Heart failure patients - Check your Weight same time everyday, if you gain over 2 pounds, or you develop in leg swelling, experience more shortness of breath or chest pain, call your Primary MD immediately. Follow Cardiac Low Salt Diet and 1.5 lit/day fluid restriction.   On your next visit with your primary care physician please Get Medicines reviewed and adjusted.   Please request your Prim.MD to go over all Hospital Tests and Procedure/Radiological results at the follow up, please get all Hospital records sent to your Prim MD by signing hospital release before you go home.   If you experience worsening of your admission symptoms, develop shortness of breath, life threatening emergency, suicidal or homicidal thoughts you must seek medical attention immediately by calling 911 or calling your MD immediately  if symptoms less severe.  You Must read complete instructions/literature along with all the possible adverse reactions/side effects for all the Medicines you take and that have been prescribed to you. Take any new Medicines after you have completely understood and accpet all the possible adverse reactions/side effects.   Do not drive, operating heavy machinery, perform activities at heights, swimming or participation in water activities or provide baby sitting services if your were admitted for syncope or siezures until you have seen by Primary MD or a Neurologist and advised to do so again.  Do not drive when taking Pain medications.    Do not take more than prescribed Pain, Sleep and Anxiety Medications  Special Instructions: If you have smoked or chewed Tobacco  in the last 2 yrs please stop smoking, stop any  regular Alcohol  and or any Recreational drug use.  Wear Seat belts while driving.   Please note  You were cared for by a hospitalist during your hospital stay. If you have any questions about your discharge medications or the care you received while you were in the hospital after you are discharged, you can call the unit and asked to speak with the hospitalist on call if the hospitalist that took care of you is not available. Once you are discharged, your primary care physician will handle any further medical issues. Please note that NO REFILLS for any discharge medications will be authorized once you are discharged, as it is imperative that you return to your primary care physician (or establish a relationship with a primary care physician if you do not have one) for your aftercare needs so that they can reassess your need for medications and monitor your lab values.  Information on my medicine - ELIQUIS (apixaban)  This medication education was reviewed with me or my healthcare representative as part of my discharge preparation.   Why was Eliquis prescribed for you? Eliquis was prescribed for you to reduce the risk of a blood clot forming that can cause a stroke if you have a medical condition called atrial fibrillation (a type of irregular heartbeat).  What do You need to know about Eliquis ? Take your Eliquis TWICE DAILY - one tablet in the morning and one tablet in the evening with or without food. If you have difficulty swallowing the tablet whole please discuss with your pharmacist  how to take the medication safely.  Take Eliquis exactly as prescribed by your doctor and DO NOT stop taking Eliquis without talking to the doctor who prescribed the medication.  Stopping may increase your risk of developing a stroke.  Refill your prescription before you run out.  After discharge, you should have regular check-up appointments with your healthcare provider that is prescribing your  Eliquis.  In the future your dose may need to be changed if your kidney function or weight changes by a significant amount or as you get older.  What do you do if you miss a dose? If you miss a dose, take it as soon as you remember on the same day and resume taking twice daily.  Do not take more than one dose of ELIQUIS at the same time to make up a missed dose.  Important Safety Information A possible side effect of Eliquis is bleeding. You should call your healthcare provider right away if you experience any of the following: ? Bleeding from an injury or your nose that does not stop. ? Unusual colored urine (red or dark brown) or unusual colored stools (red or black). ? Unusual bruising for unknown reasons. ? A serious fall or if you hit your head (even if there is no bleeding).  Some medicines may interact with Eliquis and might increase your risk of bleeding or clotting while on Eliquis. To help avoid this, consult your healthcare provider or pharmacist prior to using any new prescription or non-prescription medications, including herbals, vitamins, non-steroidal anti-inflammatory drugs (NSAIDs) and supplements.  This website has more information on Eliquis (apixaban): http://www.eliquis.com/eliquis/home

## 2015-05-25 NOTE — Care Management Note (Signed)
Case Management Note  Patient Details  Name: Nathaniel Pope MRN: [id number] Date of Birth: May 14, 1966  Subjective/Objective:                    Action/Plan: Patient will be discharging home on new Eliquis. CM met with patient and provided patient with Eliquis 30 day free card and copay assistance card.  No other discharge needs identified at this time.  Bedside RN updated. Expected Discharge Date:                  Expected Discharge Plan:  Home/Self Care  In-House Referral:     Discharge planning Services  Medication Assistance  Post Acute Care Choice:    Choice offered to:     DME Arranged:    DME Agency:     HH Arranged:    HH Agency:     Status of Service:  Completed, signed off  Medicare Important Message Given:    Date Medicare IM Given:    Medicare IM give by:    Date Additional Medicare IM Given:    Additional Medicare Important Message give by:     If discussed at [place] of Stay Meetings, dates discussed:    Additional CommentsRolm Baptise, RN 05/25/2015, 2:36 PM [phone]

## 2015-05-26 LAB — HEMOGLOBIN A1C
HEMOGLOBIN A1C: 5.6 % (ref 4.8–5.6)
Mean Plasma Glucose: 114 mg/dL

## 2015-08-12 ENCOUNTER — Ambulatory Visit (INDEPENDENT_AMBULATORY_CARE_PROVIDER_SITE_OTHER): Payer: BLUE CROSS/BLUE SHIELD | Admitting: Neurology

## 2015-08-12 ENCOUNTER — Encounter: Payer: Self-pay | Admitting: Neurology

## 2015-08-12 VITALS — BP 133/85 | HR 73 | Ht 74.0 in | Wt 217.6 lb

## 2015-08-12 DIAGNOSIS — H34232 Retinal artery branch occlusion, left eye: Secondary | ICD-10-CM

## 2015-08-12 NOTE — Patient Instructions (Signed)
I had a long discussion with the patient and his wife regarding his left eye retinal artery embolic branch occlusion likely from atrial fibrillation, personally reviewed imaging studies and hospital stroke evaluation results, discuss risk for recurrent strokes from atrial fibrillation, secondary stroke prevention strategies and answered questions. Continue eliquis for second stroke prevention and Lipitor for his lipids with LDL cholesterol goal below 70 mg percent. He is scheduled to undergo follow-up lipid profile checked by his primary physician in the LDL is not satisfactory may need to consider the new PCS K 9 inhibitors as he has history of statin intolerance as well. He will return for follow-up in 6 months with Darrol Angelarolyn Martin, nurse practitioner or call earlier if necessary Stroke Prevention Some medical conditions and behaviors are associated with an increased chance of having a stroke. You may prevent a stroke by making healthy choices and managing medical conditions. HOW CAN I REDUCE MY RISK OF HAVING A STROKE?   Stay physically active. Get at least 30 minutes of activity on most or all days.  Do not smoke. It may also be helpful to avoid exposure to secondhand smoke.  Limit alcohol use. Moderate alcohol use is considered to be:  No more than 2 drinks per day for men.  No more than 1 drink per day for nonpregnant women.  Eat healthy foods. This involves:  Eating 5 or more servings of fruits and vegetables a day.  Making dietary changes that address high blood pressure (hypertension), high cholesterol, diabetes, or obesity.  Manage your cholesterol levels.  Making food choices that are high in fiber and low in saturated fat, trans fat, and cholesterol may control cholesterol levels.  Take any prescribed medicines to control cholesterol as directed by your health care provider.  Manage your diabetes.  Controlling your carbohydrate and sugar intake is recommended to manage  diabetes.  Take any prescribed medicines to control diabetes as directed by your health care provider.  Control your hypertension.  Making food choices that are low in salt (sodium), saturated fat, trans fat, and cholesterol is recommended to manage hypertension.  Ask your health care provider if you need treatment to lower your blood pressure. Take any prescribed medicines to control hypertension as directed by your health care provider.  If you are 5518-51 years of age, have your blood pressure checked every 3-5 years. If you are 51 years of age or older, have your blood pressure checked every year.  Maintain a healthy weight.  Reducing calorie intake and making food choices that are low in sodium, saturated fat, trans fat, and cholesterol are recommended to manage weight.  Stop drug abuse.  Avoid taking birth control pills.  Talk to your health care provider about the risks of taking birth control pills if you are over 51 years old, smoke, get migraines, or have ever had a blood clot.  Get evaluated for sleep disorders (sleep apnea).  Talk to your health care provider about getting a sleep evaluation if you snore a lot or have excessive sleepiness.  Take medicines only as directed by your health care provider.  For some people, aspirin or blood thinners (anticoagulants) are helpful in reducing the risk of forming abnormal blood clots that can lead to stroke. If you have the irregular heart rhythm of atrial fibrillation, you should be on a blood thinner unless there is a good reason you cannot take them.  Understand all your medicine instructions.  Make sure that other conditions (such as anemia or atherosclerosis)  are addressed. SEEK IMMEDIATE MEDICAL CARE IF:   You have sudden weakness or numbness of the face, arm, or leg, especially on one side of the body.  Your face or eyelid droops to one side.  You have sudden confusion.  You have trouble speaking (aphasia) or  understanding.  You have sudden trouble seeing in one or both eyes.  You have sudden trouble walking.  You have dizziness.  You have a loss of balance or coordination.  You have a sudden, severe headache with no known cause.  You have new chest pain or an irregular heartbeat. Any of these symptoms may represent a serious problem that is an emergency. Do not wait to see if the symptoms will go away. Get medical help at once. Call your local emergency services (911 in [place] Do not drive yourself to the hospital.   This information is not intended to replace advice given to you by your health care provider. Make sure you discuss any questions you have with your health care provider.   Document Released: 06/08/2004 Document Revised: 05/22/2014 Document Reviewed: 11/01/2012 Elsevier Interactive Patient Education [employer].

## 2015-08-12 NOTE — Progress Notes (Signed)
[facility] [address] [place]. [place] [address] [phone]       OFFICE FOLLOW-UP NOTE  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 Nathaniel Pope is an 50 y.o. male hx of A fib, HTN referred from eye doctor for stroke workup. He reports that 2 days prior to admission he noted a sudden deficit in his left eye. Notes that there is around 25% of the middle part of his visual field in the left eye that he cannot see out of. Denies any floaters, eye pain. Went to his ophthalmologist and was diagnosed with a retinal artery occlusion. He does note a history of daily headache. Denies any speech, motor or sensory deficits. Was diagnosed with A fib around 10 years ago, currently not on any anticoagulation. Reports initially being on blood thinner but stopped after he was told he did not need it as his a fib resolved. MRI head imaging personally  reviewed, no acute infarct. He was last known well 05/22/15, time unclear. Modified Rankin: Rankin Score=0. Patient was not administered TPA secondary to being outside tPA window. He was admitted for further evaluation and treatment. MRA of the brain showed no large vessel stenosis or occlusion. Carotid ultrasound showed no significant extracranial stenosis. Total cholesterol was elevated at 279, triglycerides 313, HDL 42 and LDL 174 mg percent. Transthoracic echo showed normal left ventricular size and mild hypertrophy with normal ejection fraction and no definite cardiac source of embolism. Patient was counseled to stop smoking which she says is trying. He is has been able to drive but has to be careful. He states his tension headaches are much better on Topamax which seem to be helping is tolerating it well without side effects. ROS:   14  system review of systems is positive for ringing in the ears, vision loss only and all other systems negative  PMH:  Past Medical History  Diagnosis Date  . Migraines   . Atrial fibrillation (HCC)   . Bicuspid aortic valve   . Heart murmur   . Kidney stones   . Retinal artery occlusion, branch     05/2015  . HTN (hypertension)   . Stroke [facility](HCC)     Social History:  Social History   Social History  . Marital Status: Married    Spouse Name: N/A  . Number of Children: N/A  . Years of Education: N/A   Occupational History  . Not on file.   Social History Main Topics  . Smoking status: Former Smoker -- 1.50 packs/day for 15 years    Types: Cigarettes    Quit date: 05/29/1998  . Smokeless tobacco: Never Used  . Alcohol Use: 0.6 oz/week    1 Cans of beer per week     Comment: rarely  . Drug Use: No  . Sexual Activity: Not on file   Other Topics Concern  . Not on file   Social History Narrative    Medications:   Current Outpatient Prescriptions on File Prior to Visit  Medication Sig Dispense Refill  . apixaban (ELIQUIS) 5 MG TABS tablet Take 1 tablet (5 mg total) by mouth 2 (two) times daily. 60 tablet 0  . atorvastatin (LIPITOR) 80 MG tablet Take 1 tablet (80 mg total) by mouth daily at 6 PM. 30 tablet 0  .  topiramate (TOPAMAX) 25 MG tablet Take 1 tablet (25 mg total) by mouth 2 (two) times daily. 60 tablet 0   No current facility-administered medications on file prior to visit.    Allergies:  No Known Allergies  Physical Exam General: well developed, well nourished, seated, in no evident distress Head: head normocephalic and atraumatic.  Neck: supple with no carotid or supraclavicular bruits Cardiovascular: regular rate and rhythm, no murmurs Musculoskeletal: no deformity Skin:  no rash/petichiae Vascular:  Normal pulses all extremities Filed Vitals:   08/12/15 0821  BP: 133/85  Pulse: 73   Neurologic Exam Mental Status: Awake and fully alert. Oriented  to place and time. Recent and remote memory intact. Attention span, concentration and fund of knowledge appropriate. Mood and affect appropriate.  Cranial Nerves: Fundoscopic exam reveals sharp disc margins. Pupils equal, briskly reactive to light. Extraocular movements full without nystagmus. Visual fields  show partial nasal quadrant field loss in the left eye only confrontation. Hearing intact. Facial sensation intact. Face, tongue, palate moves normally and symmetrically.  Motor: Normal bulk and tone. Normal strength in all tested extremity muscles. Sensory.: intact to touch ,pinprick .position and vibratory sensation.  Coordination: Rapid alternating movements normal in all extremities. Finger-to-nose and heel-to-shin performed accurately bilaterally. Gait and Station: Arises from chair without difficulty. Stance is normal. Gait demonstrates normal stride length and balance . Able to heel, toe and tandem walk without difficulty.  Reflexes: 1+ and symmetric. Toes downgoing.   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 Vascular risk factors of hyperlipidemia and Atrial Fibrillation only. Tension headaches controlled on Topamax    PLAN: I had a long discussion with the patient and his wife regarding his left eye retinal artery embolic branch occlusion likely from atrial fibrillation, personally reviewed imaging studies and hospital stroke evaluation results, discuss risk for recurrent strokes from atrial fibrillation, secondary stroke prevention strategies and answered questions. Continue eliquis for second stroke prevention and Lipitor for his lipids with LDL cholesterol goal below 70 mg percent. He is scheduled to undergo follow-up lipid profile checked by his primary physician in the LDL is not satisfactory may need to consider the new PCS K 9 inhibitors as  he has history of statin intolerance as well. He will return for follow-up in 6 months with Nathaniel Pope, nurse practitioner or call earlier if necessary Greater than 50% of time during this 25 minute visit was spent on counseling,explanation of diagnosis, planning of further management, discussion with patient and family and coordination of care Delia Heady, MD Note: This document was prepared with digital dictation and possible smart phrase technology. Any transcriptional errors that result from this process are unintentional

## 2015-08-25 ENCOUNTER — Telehealth: Payer: Self-pay | Admitting: Neurology

## 2015-08-25 NOTE — Telephone Encounter (Signed)
Rn call [place] pts wife on dpr form. Rn stated per Dr. Pearlean BrownieSethi advice lipitor does not usually cause ear, throat and shoulder pain.Pt has been taking the lipitpr since January 2017 and just started having side effects. Rn stated per Dr.Sethi he should call his PCP. Pts wife they did call the PCP but have not receive a call from the office as of yet. Pts wife verbalized understanding. Rn stated to contact the PCP office again for an appt.

## 2015-08-25 NOTE — Telephone Encounter (Signed)
Patient's wife is calling stating the patient is having extreme pain in his shoulders and also pain in his throat and ear. She is wondering if this is a side effect from medication atorvastatin (LIPITOR) 80 MG tablet. Please call and discuss.

## 2016-02-14 ENCOUNTER — Encounter: Payer: Self-pay | Admitting: Nurse Practitioner

## 2016-02-14 ENCOUNTER — Ambulatory Visit (INDEPENDENT_AMBULATORY_CARE_PROVIDER_SITE_OTHER): Payer: BLUE CROSS/BLUE SHIELD | Admitting: Nurse Practitioner

## 2016-02-14 VITALS — BP 145/104 | HR 88 | Ht 74.0 in | Wt 223.6 lb

## 2016-02-14 DIAGNOSIS — I4891 Unspecified atrial fibrillation: Secondary | ICD-10-CM

## 2016-02-14 DIAGNOSIS — H34232 Retinal artery branch occlusion, left eye: Secondary | ICD-10-CM

## 2016-02-14 DIAGNOSIS — I10 Essential (primary) hypertension: Secondary | ICD-10-CM | POA: Diagnosis not present

## 2016-02-14 NOTE — Patient Instructions (Addendum)
Continue eliquis for second stroke prevention  LDL cholesterol goal below 70 mg percent.  lipid profile checked by his primary physician  F/U in 6 months

## 2016-02-14 NOTE — Progress Notes (Signed)
I have reviewed and agreed above plan. 

## 2016-02-14 NOTE — Progress Notes (Signed)
[facility]  PATIENT: Nathaniel Pope DOB: Dec 10, 1965   REASON FOR VISIT: Follow-up for left retinal artery branch infarct Jan 2017 HISTORY FROM: Patient    HISTORY OF PRESENT ILLNESS:UPDATE 02/14/16 CM Nathaniel Pope, 50 year old male returns for follow up. 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 He has stopped his topamax  since last seen He claims he has had one migraine in 8 months. Most of his headaches are tension headaches he claims, and  he takes Tylenol .The Topamax made him feel like a zombie. He is not interested in another preventative at this time Patient still has visual field deficit in the left eye however he says he has adjusted well he is driving without difficulty. Patient has also stopped his Lipitor. He had too many aches and pains in the muscles which have stopped. Lipid profile is followed by his primary care Dr. Guinevere Ferrari. He may need to consider the new PCS K 9 inhibitors as he has history of statin intolerance as well. He claims he is eating fat free  diet and no fast food. He continues to smoke but is trying to quit. He returns for reevaluation. 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 Nathaniel Pope is an 50 y.o. male hx of A fib, HTN referred from eye doctor for stroke workup. He reports that 2 days prior to admission he noted a sudden deficit in his left eye. Notes that there is around 25% of the middle part of his visual field in the left eye that he cannot see out of. Denies any floaters, eye pain. Went to his ophthalmologist and was diagnosed with a retinal artery occlusion. He does note a history of daily headache. Denies any speech, motor or sensory deficits. Was  diagnosed with A fib around 10 years ago, currently not on any anticoagulation. Reports initially being on blood thinner but stopped after he was told he did not need it as his a fib resolved. MRI head imaging personally  reviewed, no acute infarct. He was last known well 05/22/15, time unclear. Modified Rankin: Rankin Score=0. Patient was not administered TPA secondary to being outside tPA window. He was admitted for further evaluation and treatment. MRA of the brain showed no large vessel stenosis or occlusion. Carotid ultrasound showed no significant extracranial stenosis. Total cholesterol was elevated at 279, triglycerides 313, HDL 42 and LDL 174 mg percent. Transthoracic echo showed normal left ventricular size and mild hypertrophy with normal ejection fraction and no definite cardiac source of embolism. Patient was counseled to stop smoking which he says is trying. He is has been able to drive but has to be careful. He states his tension headaches are much better on Topamax which seem to be helping is tolerating it well without side effects.  REVIEW OF SYSTEMS: Full 14 system review of systems performed and notable only for those listed, all others are neg:  Constitutional: neg  Cardiovascular: neg Ear/Nose/Throat: neg  Skin: neg Eyes: neg Respiratory: neg Gastroitestinal: neg  Hematology/Lymphatic: neg  Endocrine: neg Musculoskeletal:neg Allergy/Immunology: neg Neurological: neg Psychiatric: neg Sleep : neg   ALLERGIES: Allergies  Allergen Reactions  . Lipitor [Atorvastatin]     Myalgias,   . Topamax [  Topiramate]     Zombie feeling, forgetful, decreased concentration    HOME MEDICATIONS: Outpatient Medications Prior to Visit  Medication Sig Dispense Refill  . acetaminophen (TYLENOL) 325 MG tablet Take 650 mg by mouth every 6 (six) hours as needed.    Marland Kitchen apixaban (ELIQUIS) 5 MG TABS tablet Take 1 tablet (5 mg total) by mouth 2 (two) times daily. 60 tablet 0  . atorvastatin  (LIPITOR) 80 MG tablet Take 1 tablet (80 mg total) by mouth daily at 6 PM. 30 tablet 0  . topiramate (TOPAMAX) 25 MG tablet Take 1 tablet (25 mg total) by mouth 2 (two) times daily. 60 tablet 0   No facility-administered medications prior to visit.     PAST MEDICAL HISTORY: Past Medical History:  Diagnosis Date  . Atrial fibrillation (HCC)   . Bicuspid aortic valve   . Heart murmur   . HTN (hypertension)   . Kidney stones   . Migraines   . Retinal artery occlusion, branch    05/2015  . Stroke [facility])     PAST SURGICAL HISTORY: Past Surgical History:  Procedure Laterality Date  . HERNIA REPAIR    . ruptured kidney wall    . TONSILLECTOMY      FAMILY HISTORY: Family History  Problem Relation Age of Onset  . Diabetes Mother   . Cancer Father     SOCIAL HISTORY: Social History   Social History  . Marital status: Married    Spouse name: N/A  . Number of children: N/A  . Years of education: N/A   Occupational History  . Not on file.   Social History Main Topics  . Smoking status: Former Smoker    Packs/day: 1.50    Years: 15.00    Types: Cigarettes    Quit date: 05/29/1998  . Smokeless tobacco: Never Used  . Alcohol use 0.6 oz/week    1 Cans of beer per week     Comment: rarely  . Drug use: No  . Sexual activity: Not on file   Other Topics Concern  . Not on file   Social History Narrative  . No narrative on file     PHYSICAL EXAM  Vitals:   02/14/16 0756  BP: (!) 145/104  Pulse: 88  Weight: 223 lb 9.6 oz (101.4 kg)  Height: 6\' 2"  (1.88 m)   Body mass index is 28.71 kg/m. General: well developed, well nourished, seated, in no evident distress Head: head normocephalic and atraumatic.  Neck: supple with no carotid or bruits Cardiovascular: regular rate and rhythm, no murmurs Musculoskeletal: no deformity Skin:  no rash/petichiae Vascular:  Normal pulses all extremities    Neurological examination  Mental Status: Awake and fully alert.  Oriented to place and time. Recent and remote memory intact. Attention span, concentration and fund of knowledge appropriate. Mood and affect appropriate.  Cranial Nerves:  Visual acuity 20/20 -2 right 20/30 -1 left . Pupils equal, briskly reactive to light. Extraocular movements full without nystagmus. Visual fields  show partial nasal quadrant field loss in the left eye only confrontation. Hearing intact. Facial sensation intact. Face, tongue, palate moves normally and symmetrically.  Motor: Normal bulk and tone. Normal strength in all tested extremity muscles. Sensory.: intact to touch ,pinprick .position and vibratory sensation.  Coordination: Rapid alternating movements normal in all extremities. Finger-to-nose and heel-to-shin performed accurately bilaterally. Gait and Station: Arises from chair without difficulty. Stance is normal. Gait demonstrates normal stride length and balance . Able to heel, toe  and tandem walk without   Reflexes + and symme1tric toes downgoing  DIAGNOSTIC DATA (LABS, IMAGING, TESTING) - I reviewed patient records, labs, notes, testing and imaging myself where available.  Lab Results  Component Value Date   WBC 5.4 05/24/2015   HGB 15.6 05/24/2015   HCT 46.0 05/24/2015   MCV 91.3 05/24/2015   PLT 230 05/24/2015      Component Value Date/Time   NA 142 05/24/2015 1543   K 3.9 05/24/2015 1543   CL 104 05/24/2015 1543   CO2 23 05/24/2015 1452   GLUCOSE 86 05/24/2015 1543   BUN 10 05/24/2015 1543   CREATININE 0.90 05/24/2015 1543   CREATININE 0.85 06/01/2013 1028   CALCIUM 9.5 05/24/2015 1452   PROT 7.0 05/24/2015 1533   ALBUMIN 3.9 05/24/2015 1533   AST 23 05/24/2015 1533   ALT 41 05/24/2015 1533   ALKPHOS 80 05/24/2015 1533   BILITOT 0.5 05/24/2015 1533   GFRNONAA >60 05/24/2015 1452   GFRAA >60 05/24/2015 1452   Lab Results  Component Value Date   CHOL 279 (H) 05/24/2015   HDL 42 05/24/2015   LDLCALC 174 (H) 05/24/2015   TRIG 313 (H) 05/24/2015     CHOLHDL 6.6 05/24/2015   Lab Results  Component Value Date   HGBA1C 5.6 05/24/2015    ASSESSMENT AND PLAN [age] year Caucasian male with 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 Vascular risk factors of hyperlipidemia and Atrial Fibrillation only. Tension headaches controlled on  Tylenol. Previous records reviewed.The patient is a current patient of Dr. Pearlean BrownieSethi who is out of the office today . This note is sent to the work in doctor.     Continue eliquis for secondary  stroke prevention  LDL cholesterol goal below 70 mg percent.Lipid profile checked by his PCP Dr. Guinevere FerrariKnowleton. He has stopped his Lipitor .He may need to consider the new PCS K 9 inhibitors as he has history of statin intolerance as well. Patient states she is eating a fat free  Diet Stop smoking altogether F/U in 6 months if stable will dismiss. Nilda RiggsNancy Carolyn Martin, [facility] IrmoGNP, [facility] Ph[facility]BC, APRN  [facility] [address], [address] [place], [place] [address] [phone]

## 2016-08-17 ENCOUNTER — Encounter: Payer: Self-pay | Admitting: Nurse Practitioner

## 2016-08-17 ENCOUNTER — Ambulatory Visit (INDEPENDENT_AMBULATORY_CARE_PROVIDER_SITE_OTHER): Payer: BLUE CROSS/BLUE SHIELD | Admitting: Nurse Practitioner

## 2016-08-17 VITALS — BP 138/90 | HR 89 | Wt 225.4 lb

## 2016-08-17 DIAGNOSIS — I10 Essential (primary) hypertension: Secondary | ICD-10-CM | POA: Diagnosis not present

## 2016-08-17 DIAGNOSIS — I4891 Unspecified atrial fibrillation: Secondary | ICD-10-CM

## 2016-08-17 DIAGNOSIS — H34232 Retinal artery branch occlusion, left eye: Secondary | ICD-10-CM | POA: Diagnosis not present

## 2016-08-17 DIAGNOSIS — Z9114 Patient's other noncompliance with medication regimen: Secondary | ICD-10-CM

## 2016-08-17 DIAGNOSIS — Z91148 Patient's other noncompliance with medication regimen for other reason: Secondary | ICD-10-CM

## 2016-08-17 NOTE — Progress Notes (Signed)
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 Nathaniel Pope was on eliquis when last seen the says Nathaniel Pope stopped the medication around December. Discussed the reasoning for the medication particularly since it due to his atrial fibrillation and stroke prevention. Nathaniel Pope also has a history of hyperlipidemia and had stopped his Lipitor at his last visit. Nathaniel Pope has poor follow-up with his primary care Dr. Guinevere Ferrari. Nathaniel Pope continues to work 7 days a week. Nathaniel Pope denies any recent headaches. Nathaniel Pope takes Tylenol when necessary Nathaniel Pope denies any speech or swallowing problems increased visual symptoms etc. Nathaniel Pope continues to have a visual field deficit in the left eye however Nathaniel Pope claims Nathaniel Pope has adjusted and is driving without difficulty. Nathaniel Pope continues to smoke but has cut back. Nathaniel Pope claims Nathaniel Pope is eating fat free diet and is taking fish oil. Nathaniel Pope returns for reevaluation.    UPDATE 02/14/16 CM Nathaniel Pope, 51 year old male returns for follow up. 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 Nathaniel Pope has stopped his topamax  since last seen Nathaniel Pope claims Nathaniel Pope has had one migraine in 8 months. Most of his headaches are tension headaches Nathaniel Pope claims, and  Nathaniel Pope takes Tylenol .The Topamax made him feel like a zombie. Nathaniel Pope is not interested in another preventative at this time Patient still has visual field deficit in the left eye however Nathaniel Pope says Nathaniel Pope has adjusted well Nathaniel Pope is driving without difficulty. Patient has also stopped his Lipitor. Nathaniel Pope had too many aches and pains in the muscles which have stopped. Lipid profile is followed by his  primary care Dr. Guinevere Ferrari. Nathaniel Pope may need to consider the new PCS K 9 inhibitors as Nathaniel Pope has history of statin intolerance as well. Nathaniel Pope claims Nathaniel Pope is eating fat free  diet and no fast food. Nathaniel Pope continues to smoke but is trying to quit. Nathaniel Pope returns for reevaluation. 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 Nathaniel Pope is an 51 y.o. male hx of A fib, HTN referred from eye doctor for stroke workup. Nathaniel Pope reports that 2 days prior to admission Nathaniel Pope noted a sudden deficit in his left eye. Notes that there is around 25% of the middle part of his visual field in the left eye that Nathaniel Pope cannot see out of. Denies any floaters, eye pain. Went to his ophthalmologist and was diagnosed with a retinal artery occlusion. Nathaniel Pope does note a history of daily headache. Denies any speech, motor or sensory deficits. Was diagnosed with A fib around 10 years ago, currently not on any anticoagulation. Reports initially being on blood thinner but stopped after Nathaniel Pope was told Nathaniel Pope did not need it as his a fib resolved. MRI head imaging personally  reviewed, no acute infarct. Nathaniel Pope was last known well 05/22/15, time unclear. Modified Rankin: Rankin Score=0. Patient was not administered TPA secondary to being outside tPA window. Nathaniel Pope was admitted for further evaluation and treatment. MRA of the brain showed no large vessel stenosis or occlusion. Carotid  ultrasound showed no significant extracranial stenosis. Total cholesterol was elevated at 279, triglycerides 313, HDL 42 and LDL 174 mg percent. Transthoracic echo showed normal left ventricular size and mild hypertrophy with normal ejection fraction and no definite cardiac source of embolism. Patient was counseled to stop smoking which Nathaniel Pope says is trying. Nathaniel Pope is has been able to drive but has to be careful. Nathaniel Pope states his tension headaches are much better on  Topamax which seem to be helping is tolerating it well without side effects.  REVIEW OF SYSTEMS: Full 14 system review of systems performed and notable only for those listed, all others are neg:  Constitutional: neg  Cardiovascular: neg Ear/Nose/Throat: neg  Skin: neg Eyes: neg Respiratory: neg Gastroitestinal: neg  Hematology/Lymphatic: neg  Endocrine: neg Musculoskeletal:neg Allergy/Immunology: neg Neurological: neg Psychiatric: neg Sleep : neg   ALLERGIES: Allergies  Allergen Reactions  . Lipitor [Atorvastatin]     Myalgias,   . Topamax [Topiramate]     Zombie feeling, forgetful, decreased concentration    HOME MEDICATIONS: Outpatient Medications Prior to Visit  Medication Sig Dispense Refill  . acetaminophen (TYLENOL) 325 MG tablet Take 650 mg by mouth every 6 (six) hours as needed.    Marland Kitchen apixaban (ELIQUIS) 5 MG TABS tablet Take 1 tablet (5 mg total) by mouth 2 (two) times daily. (Patient not taking: Reported on 08/17/2016) 60 tablet 0   No facility-administered medications prior to visit.     PAST MEDICAL HISTORY: Past Medical History:  Diagnosis Date  . Atrial fibrillation (HCC)   . Bicuspid aortic valve   . Heart murmur   . HTN (hypertension)   . Kidney stones   . Migraines   . Retinal artery occlusion, branch    05/2015  . Stroke [facility])     PAST SURGICAL HISTORY: Past Surgical History:  Procedure Laterality Date  . HERNIA REPAIR    . ruptured kidney wall    . TONSILLECTOMY      FAMILY HISTORY: Family History  Problem Relation Age of Onset  . Diabetes Mother   . Cancer Father     SOCIAL HISTORY: Social History   Social History  . Marital status: Married    Spouse name: N/A  . Number of children: N/A  . Years of education: N/A   Occupational History  . Not on file.   Social History Main Topics  . Smoking status: Former Smoker    Packs/day: 1.50    Years: 15.00    Types: Cigarettes    Quit date: 05/29/1998  . Smokeless tobacco: Never  Used  . Alcohol use 0.6 oz/week    1 Cans of beer per week     Comment: rarely  . Drug use: No  . Sexual activity: Not on file   Other Topics Concern  . Not on file   Social History Narrative  . No narrative on file     PHYSICAL EXAM  Vitals:   08/17/16 0734  BP: (!) 138/90   Pulse: 89  Weight: 225 lb 6.4 oz (102.2 kg)   Body mass index is 28.94 kg/m. General: well developed, well nourished, seated, in no evident distress Head: head normocephalic and atraumatic.  Neck: supple with no carotid or bruits Cardiovascular: regular rate and rhythm, no murmurs Musculoskeletal: no deformity Skin:  no rash/petichiae Vascular:  Normal pulses all extremities    Neurological examination  Mental Status: Awake and fully alert. Oriented to place and time. Recent and remote memory intact. Attention span, concentration and fund  of knowledge appropriate. Mood and affect appropriate.  Cranial Nerves:  . Pupils equal, briskly reactive to light. Extraocular movements full without nystagmus. Visual fields  show partial nasal quadrant field loss in the left eye only. Hearing intact. Facial sensation intact. Face, tongue, palate moves normally and symmetrically.  Motor: Normal bulk and tone. Normal strength in all tested extremity muscles. Sensory.: intact to touch , the upper and lower extremities.  Coordination: Rapid alternating movements normal in all extremities. Finger-to-nose and heel-to-shin performed accurately bilaterally. Gait and Station: Arises from chair without difficulty. Stance is normal. Gait demonstrates normal stride length and balance . Able to heel, toe and tandem walk without   Reflexes1 + and symme1tric toes downgoing  DIAGNOSTIC DATA (LABS, IMAGING, TESTING) - I reviewed patient records, labs, notes, testing and imaging myself where available.  Lab Results  Component Value Date   WBC 5.4 05/24/2015   HGB 15.6 05/24/2015   HCT 46.0 05/24/2015   MCV 91.3 05/24/2015    PLT 230 05/24/2015      Component Value Date/Time   NA 142 05/24/2015 1543   K 3.9 05/24/2015 1543   CL 104 05/24/2015 1543   CO2 23 05/24/2015 1452   GLUCOSE 86 05/24/2015 1543   BUN 10 05/24/2015 1543   CREATININE 0.90 05/24/2015 1543   CREATININE 0.85 06/01/2013 1028   CALCIUM 9.5 05/24/2015 1452   PROT 7.0 05/24/2015 1533   ALBUMIN 3.9 05/24/2015 1533   AST 23 05/24/2015 1533   ALT 41 05/24/2015 1533   ALKPHOS 80 05/24/2015 1533   BILITOT 0.5 05/24/2015 1533   GFRNONAA >60 05/24/2015 1452   GFRAA >60 05/24/2015 1452   Lab Results  Component Value Date   CHOL 279 (H) 05/24/2015   HDL 42 05/24/2015   LDLCALC 174 (H) 05/24/2015   TRIG 313 (H) 05/24/2015   CHOLHDL 6.6 05/24/2015   Lab Results  Component Value Date   HGBA1C 5.6 05/24/2015    ASSESSMENT AND PLAN 30 year Caucasian male with 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 Vascular risk factors of hyperlipidemia and Atrial Fibrillation only. Tension headaches controlled on  Tylenol. The patient is non compliant with taking his statin drug and eliquis. The patient is a current patient of Dr. Pearlean Brownie who is out of the office today . This note is sent to the work in doctor.     Keep systolic blood pressure less than 130, today's reading 138/90 , patient claims Nathaniel Pope keeps a record of B/P at home  Lipids are followed by PCP needs to make follow uo appt for labs and follow-up for secondary stroke prevention risk factors Patient has stopped Lipitor due to intolerance Pt has stopped eliquis with history of at fib and retinal artery stroke No further stroke or TIA symptoms since Jan 2017 If recurrent stroke symptoms occur, call 911 and proceed to the hospital Discharge from neurologic services at this time I spent 15 min  in total face to face time with the patient more than 50% of which was spent counseling and coordination of care, reviewing test results  reviewing medications and discussing and reviewing the diagnosis of stroke in control of risk factors. I'm concerned that Nathaniel Pope has been noncompliant with his statin drug as well as its eliquis for . secondary stroke prevention and atrial fib  Nilda Riggs, [facility], [facility], APRN  [facility] [address] [place], [place] [address] [phone]

## 2016-08-17 NOTE — Progress Notes (Signed)
I agree with the assessment and plan as directed by NP .The patient is known to me .   Gali Spinney, MD  

## 2016-08-17 NOTE — Patient Instructions (Addendum)
Keep systolic blood pressure less than 130, today's reading 138/90  Lipids are followed by PCP  Patient has stopped Lipitor due to intolerance Pt has stopped eliquis with history of at fib and retinal artery stroke No further stroke or TIA symptoms since Jan 2017 If recurrent stroke symptoms occur, call 911 and proceed to the hospital Discharge from neurologic services at this timeStroke Prevention Some medical conditions and behaviors are associated with an increased chance of having a stroke. You may prevent a stroke by making healthy choices and managing medical conditions. How can I reduce my risk of having a stroke?  Stay physically active. Get at least 30 minutes of activity on most or all days.  Do not smoke. It may also be helpful to avoid exposure to secondhand smoke.  Limit alcohol use. Moderate alcohol use is considered to be:  No more than 2 drinks per day for men.  No more than 1 drink per day for nonpregnant women.  Eat healthy foods. This involves:  Eating 5 or more servings of fruits and vegetables a day.  Making dietary changes that address high blood pressure (hypertension), high cholesterol, diabetes, or obesity.  Manage your cholesterol levels.  Making food choices that are high in fiber and low in saturated fat, trans fat, and cholesterol may control cholesterol levels.  Take any prescribed medicines to control cholesterol as directed by your health care provider.  Manage your diabetes.  Controlling your carbohydrate and sugar intake is recommended to manage diabetes.  Take any prescribed medicines to control diabetes as directed by your health care provider.  Control your hypertension.  Making food choices that are low in salt (sodium), saturated fat, trans fat, and cholesterol is recommended to manage hypertension.  Ask your health care provider if you need treatment to lower your blood pressure. Take any prescribed medicines to control hypertension as  directed by your health care provider.  If you are 49-21 years of age, have your blood pressure checked every 3-5 years. If you are 65 years of age or older, have your blood pressure checked every year.  Maintain a healthy weight.  Reducing calorie intake and making food choices that are low in sodium, saturated fat, trans fat, and cholesterol are recommended to manage weight.  Stop drug abuse.  Avoid taking birth control pills.  Talk to your health care provider about the risks of taking birth control pills if you are over 35 years old, smoke, get migraines, or have ever had a blood clot.  Get evaluated for sleep disorders (sleep apnea).  Talk to your health care provider about getting a sleep evaluation if you snore a lot or have excessive sleepiness.  Take medicines only as directed by your health care provider.  For some people, aspirin or blood thinners (anticoagulants) are helpful in reducing the risk of forming abnormal blood clots that can lead to stroke. If you have the irregular heart rhythm of atrial fibrillation, you should be on a blood thinner unless there is a good reason you cannot take them.  Understand all your medicine instructions.  Make sure that other conditions (such as anemia or atherosclerosis) are addressed. Get help right away if:  You have sudden weakness or numbness of the face, arm, or leg, especially on one side of the body.  Your face or eyelid droops to one side.  You have sudden confusion.  You have trouble speaking (aphasia) or understanding.  You have sudden trouble seeing in one or both eyes.  You have sudden trouble walking.  You have dizziness.  You have a loss of balance or coordination.  You have a sudden, severe headache with no known cause.  You have new chest pain or an irregular heartbeat. Any of these symptoms may represent a serious problem that is an emergency. Do not wait to see if the symptoms will go away. Get medical  help at once. Call your local emergency services (911 in [place] Do not drive yourself to the hospital. This information is not intended to replace advice given to you by your health care provider. Make sure you discuss any questions you have with your health care provider. Document Released: 06/08/2004 Document Revised: 10/07/2015 Document Reviewed: 11/01/2012 Elsevier Interactive Patient Education  2017 [employer].

## 2016-08-18 ENCOUNTER — Telehealth: Payer: Self-pay | Admitting: *Deleted

## 2016-08-18 NOTE — Telephone Encounter (Signed)
Per Enid Skeens, NP, her office note dated 08/17/16 faxed to patient's PCP, Dr Michelle Nasuti.

## 2019-04-15 ENCOUNTER — Other Ambulatory Visit: Payer: Self-pay

## 2019-04-15 DIAGNOSIS — Z20822 Contact with and (suspected) exposure to covid-19: Secondary | ICD-10-CM

## 2019-04-17 LAB — NOVEL CORONAVIRUS, NAA: SARS-CoV-2, NAA: NOT DETECTED

## 2019-04-18 ENCOUNTER — Telehealth: Payer: Self-pay

## 2019-04-18 NOTE — Telephone Encounter (Signed)
Patient given negative result and verbalized understanding  

## 2019-07-28 ENCOUNTER — Other Ambulatory Visit (HOSPITAL_COMMUNITY): Payer: Self-pay | Admitting: Preventative Medicine

## 2019-07-28 DIAGNOSIS — R011 Cardiac murmur, unspecified: Secondary | ICD-10-CM

## 2019-08-01 ENCOUNTER — Other Ambulatory Visit: Payer: Self-pay

## 2019-08-01 ENCOUNTER — Ambulatory Visit (HOSPITAL_COMMUNITY)
Admission: RE | Admit: 2019-08-01 | Discharge: 2019-08-01 | Disposition: A | Discharge status: Home / Self Care | Payer: BC Managed Care – PPO | Source: Ambulatory Visit | Attending: Family Medicine | Admitting: Family Medicine

## 2019-08-01 DIAGNOSIS — R011 Cardiac murmur, unspecified: Secondary | ICD-10-CM | POA: Insufficient documentation

## 2019-08-01 NOTE — Progress Notes (Signed)
*  PRELIMINARY RESULTS* Echocardiogram 2D Echocardiogram has been performed.  Nathaniel Pope 08/01/2019, 3:53 PM

## 2020-02-05 ENCOUNTER — Other Ambulatory Visit: Payer: Self-pay

## 2020-02-05 DIAGNOSIS — Z20822 Contact with and (suspected) exposure to covid-19: Secondary | ICD-10-CM

## 2020-02-07 LAB — SARS-COV-2, NAA 2 DAY TAT

## 2020-02-07 LAB — NOVEL CORONAVIRUS, NAA: SARS-CoV-2, NAA: NOT DETECTED

## 2020-02-07 LAB — SPECIMEN STATUS REPORT
